# Patient Record
Sex: Male | Born: 1950 | Race: White | Hispanic: No | Marital: Married | Smoking: Never smoker
Health system: Southern US, Community
[De-identification: ages and names within clinical notes are randomized; demographics above are authoritative.]

## PROBLEM LIST (undated history)

## (undated) DIAGNOSIS — Z9081 Acquired absence of spleen: Secondary | ICD-10-CM

## (undated) DIAGNOSIS — I10 Essential (primary) hypertension: Secondary | ICD-10-CM

## (undated) DIAGNOSIS — I2510 Atherosclerotic heart disease of native coronary artery without angina pectoris: Secondary | ICD-10-CM

## (undated) DIAGNOSIS — E119 Type 2 diabetes mellitus without complications: Secondary | ICD-10-CM

## (undated) DIAGNOSIS — E785 Hyperlipidemia, unspecified: Secondary | ICD-10-CM

## (undated) HISTORY — PX: CARDIAC SURGERY: SHX584

---

## 2013-09-08 ENCOUNTER — Encounter (HOSPITAL_COMMUNITY): Payer: Self-pay

## 2013-09-08 ENCOUNTER — Ambulatory Visit (HOSPITAL_COMMUNITY)
Admission: RE | Admit: 2013-09-08 | Discharge: 2013-09-08 | Disposition: A | Discharge status: Home / Self Care | Payer: Medicare Other | Source: Ambulatory Visit | Attending: Internal Medicine | Admitting: Internal Medicine

## 2013-09-08 VITALS — BP 122/78 | HR 71 | Wt 214.0 lb

## 2013-09-08 DIAGNOSIS — I2510 Atherosclerotic heart disease of native coronary artery without angina pectoris: Secondary | ICD-10-CM | POA: Insufficient documentation

## 2013-09-08 DIAGNOSIS — R079 Chest pain, unspecified: Secondary | ICD-10-CM | POA: Insufficient documentation

## 2013-09-08 DIAGNOSIS — Z951 Presence of aortocoronary bypass graft: Secondary | ICD-10-CM | POA: Insufficient documentation

## 2013-09-08 HISTORY — DX: Type 2 diabetes mellitus without complications: E11.9

## 2013-09-08 HISTORY — DX: Atherosclerotic heart disease of native coronary artery without angina pectoris: I25.10

## 2013-09-08 HISTORY — DX: Hyperlipidemia, unspecified: E78.5

## 2013-09-08 HISTORY — DX: Acquired absence of spleen: Z90.81

## 2013-09-08 HISTORY — DX: Essential (primary) hypertension: I10

## 2013-09-08 LAB — CBC
HCT: 49.4 % (ref 39.0–52.0)
Hemoglobin: 17.2 g/dL — ABNORMAL HIGH (ref 13.0–17.0)
MCH: 32 pg (ref 26.0–34.0)
MCHC: 34.8 g/dL (ref 30.0–36.0)
MCV: 91.8 fL (ref 78.0–100.0)
Platelets: 457 10*3/uL — ABNORMAL HIGH (ref 150–400)
RBC: 5.38 MIL/uL (ref 4.22–5.81)
RDW: 14.4 % (ref 11.5–15.5)
WBC: 14.2 10*3/uL — AB (ref 4.0–10.5)

## 2013-09-08 LAB — BASIC METABOLIC PANEL
BUN: 17 mg/dL (ref 6–23)
CALCIUM: 9.6 mg/dL (ref 8.4–10.5)
CHLORIDE: 99 meq/L (ref 96–112)
CO2: 23 mEq/L (ref 19–32)
Creatinine, Ser: 1.21 mg/dL (ref 0.50–1.35)
GFR calc Af Amer: 73 mL/min — ABNORMAL LOW (ref >90)
GFR calc non Af Amer: 63 mL/min — ABNORMAL LOW (ref >90)
Glucose, Bld: 170 mg/dL — ABNORMAL HIGH (ref 70–99)
Potassium: 4.8 mEq/L (ref 3.7–5.3)
Sodium: 137 mEq/L (ref 137–147)

## 2013-09-08 LAB — PROTIME-INR
INR: 0.94 (ref 0.00–1.49)
PROTHROMBIN TIME: 12.4 s (ref 11.6–15.2)

## 2013-09-08 NOTE — Progress Notes (Signed)
 Primary cardiologist: Dr. Kalil ([place]) PCP: Dr. Jobe  Reason for consult: 2nd opinion on CAD  HPI:  Brandon Velez is a 63 y/o male with DM2, CAD s/p CABG in 2004 ([place] - Dr. 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   We do not have any outside records to review. Says he has never had a heart attack but was found to have severe CAD and underwent CABG in 2004. Has been to see multiple cardiologists over the year. Including Dr Tohan at [facility] as well as Dr. Joe Rogers at [facility] and Dr. Tom Posvic with regard to stem cell study - 2 years ago. Has been told in past he doesn't need Transplant evaluation. Also was turned down for stem cell study.  His primary cardiologist has told him he needs to have another transplant evaluation due to persistent angina. Does not know his EF. Last cath 2 years ago - has had 10 total. He thinks 3/5 bypass grafts were blocked.   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 Continues with CP every day despite multiple agents including Ranexa. Unable to tolerate carvedilol due to fatigue. No ASA due to stomach irritation. Can tolerate Plavix. Can come on at rest and with any exertion. Not worse with exertion. Several times per day and last for several hours. Usually gets relief with NTG. +fatigue. CP much worse over past few weeks. At church 3 weeks ago was carrying a table and had episode of CP fell to his knees and vomited. Says ranexa has helped. Has not tried Lidoderm patch.   Multiple other complaints including: dizziness, leg weakness which he associates with his CP, + nausea. He is s/p cholecystectomy.   Discussed case with his cardiologists in [place] and they said there was nothing else they could do so he was referred here.    Review of Systems: [y] = yes, [ ] = no   General: Weight  gain [ ]; Weight loss [ ]; Anorexia [ ]; Fatigue [ y]; Fever [ ]; Chills [ ]; Weakness [ ]  Cardiac: Chest pain/pressure [y ]; Resting SOB [ ]; Exertional SOB [y ]; Orthopnea [ ]; Pedal Edema [ ]; Palpitations [ ]; Syncope [y ]; Presyncope [y ]; Paroxysmal nocturnal dyspnea[ ]  Pulmonary: Cough [ ]; Wheezing[ ]; Hemoptysis[ ]; Sputum [ ]; Snoring [ ]  GI: Vomiting[ y]; Dysphagia[ ]; Melena[ ]; Hematochezia [ ]; Heartburn[ ]; Abdominal pain [ ]; Constipation [ ]; Diarrhea [ ]; BRBPR [ ]  GU: Hematuria[ ]; Dysuria [ ]; Nocturia[ ]  Vascular: Pain in legs with walking [ ]; Pain in feet with lying flat [ ]; Non-healing sores [ ]; Stroke [ ]; TIA [ ]; Slurred speech [ ];  Neuro: Headaches[ ]; Vertigo[ ]; Seizures[ ]; Paresthesias[ ];Blurred vision [ ]; Diplopia [ ]; Vision changes [ ]  Ortho/Skin: Arthritis [ ]; Joint pain [y ]; Muscle pain [ ]; Joint swelling [ ]; Back Pain [ ]; Rash [ ]  Psych: Depression[ ]; Anxiety[ ]  Heme: Bleeding problems [ ]; Clotting disorders [ ]; Anemia [ ]  Endocrine: Diabetes [y ]; Thyroid dysfunction[ ]   Past Medical History  Diagnosis Date  . CAD (coronary artery disease)     s/p CABG 2004  . HTN (hypertension)   . Hyperlipidemia   . H/O   splenectomy   . Diabetes mellitus     Current Outpatient Prescriptions  Medication Sig Dispense Refill  . amLODipine (NORVASC) 5 MG tablet Take 5 mg by mouth daily.      . benazepril (LOTENSIN) 40 MG tablet Take 40 mg by mouth daily.      . Canagliflozin (INVOKANA) 300 MG TABS Take by mouth.      . cetirizine (ZYRTEC) 10 MG tablet Take 10 mg by mouth daily.      . clopidogrel (PLAVIX) 75 MG tablet Take 75 mg by mouth daily with breakfast.      . famotidine (PEPCID) 40 MG tablet Take 40 mg by mouth daily.      . gabapentin (NEURONTIN) 300 MG capsule Take 300 mg by mouth 3 (three) times daily.      . glipiZIDE (GLUCOTROL) 10 MG tablet Take 10 mg by mouth daily before breakfast.      . hydrochlorothiazide (HYDRODIURIL) 25 MG  tablet Take 25 mg by mouth daily.      . insulin glargine (LANTUS) 100 UNIT/ML injection Inject 35 Units into the skin at bedtime.      . insulin lispro (HUMALOG) 100 UNIT/ML injection Inject 4-16 Units into the skin 3 (three) times daily before meals.      . isosorbide mononitrate (IMDUR) 60 MG 24 hr tablet Take 60 mg by mouth daily.      . meclizine (ANTIVERT) 25 MG tablet Take 25 mg by mouth 3 (three) times daily as needed for dizziness.      . ranolazine (RANEXA) 500 MG 12 hr tablet Take 500 mg by mouth 2 (two) times daily.      . rosuvastatin (CRESTOR) 10 MG tablet Take 10 mg by mouth daily.       No current facility-administered medications for this encounter.    Allergies  Allergen Reactions  . Codeine   . Eggs Or Egg-Derived Products   . Penicillins     History   Social History  . Marital Status: Unknown    Spouse Name: N/A    Number of Children: N/A  . Years of Education: N/A   Occupational History  . Not on file.   Social History Main Topics  . Smoking status: Never Smoker   . Smokeless tobacco: Not on file  . Alcohol Use: Not on file  . Drug Use: Not on file  . Sexual Activity: Not on file   Other Topics Concern  . 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 Heart attack Father     died 1996 - 62 y/o  . Heart failure Father   . CAD Mother     s/p CABG 1996    PHYSICAL EXAM: Filed Vitals:   09/08/13 1201  BP: 122/78  Pulse: 71  Weight: 214 lb (97.07 kg)  SpO2: 99%    General:  Well appearing. No respiratory difficulty HEENT: normal Neck: supple. no JVD. Carotids 2+ bilat; no bruits. No lymphadenopathy or thryomegaly appreciated. Cor: PMI nondisplaced. Regular rate & rhythm. No rubs, gallops or murmurs. Lungs: clear Abdomen: soft, nontender, nondistended. No hepatosplenomegaly. No bruits or masses. Good bowel sounds. Extremities: no cyanosis, clubbing, rash,  edema Neuro: alert & oriented x 3, cranial nerves grossly intact. moves all 4 extremities w/o difficulty. Affect pleasant.  ECG:SR 74  1AVB (218 ms) TWI I, AVL - no old to compare   ASSESSMENT &   PLAN:  1) CAD s/p CABG 2004 2) Chest pain, ongoing 3) DM2  Very difficult situation. He has a chronic chest pain syndrome which is now worse. He has had extensive evaluations at [facility] and [facility] without much resolution. I am not sure that all of his pain in cardiac in nature but quality of pain is concerning. Given that his last cath was over 2 years ago I think the first step is to repeat his heart catheterization. That said, I discussed whether the cath should be done here or back at [facility]. He will contact [facility] and if unable to get an appt in the very near future he will get back to us and we will schedule cath. We also discussed possibility of trying Lidoderm patch. Will draw pre-cath labs today.   Daniel Bensimhon,MD 1:03 PM   

## 2013-09-08 NOTE — Patient Instructions (Signed)
Labs today  Call us back if you want to have Cath done here

## 2013-09-09 ENCOUNTER — Telehealth (HOSPITAL_COMMUNITY): Payer: Self-pay

## 2013-09-09 ENCOUNTER — Other Ambulatory Visit (HOSPITAL_COMMUNITY): Payer: Self-pay

## 2013-09-09 NOTE — Addendum Note (Signed)
Encounter addended by: Deitra MayoBeverly H George Alcantar, CCT on: 09/09/2013  9:57 AM<BR>     Documentation filed: Charges VN

## 2013-09-09 NOTE — Telephone Encounter (Signed)
Patient called and made aware of cardiac cath scheduled for 3/27 at 0730 with Dr. SwazilandJordan.  Instructed to arrive to main entrance for check in at 0600, to remain NPO after midnight, to have someone to drive him home afterwards, to pack an overnight bag, and to keep all valuables at home.  Patient understands and has no questions/concerns.

## 2013-09-13 ENCOUNTER — Encounter (HOSPITAL_COMMUNITY): Payer: Self-pay | Admitting: Pharmacy Technician

## 2013-09-16 ENCOUNTER — Encounter (HOSPITAL_COMMUNITY)
Admission: RE | Disposition: A | Discharge status: Home / Self Care | Payer: Self-pay | Source: Ambulatory Visit | Attending: Internal Medicine

## 2013-09-16 ENCOUNTER — Ambulatory Visit (HOSPITAL_COMMUNITY)
Admission: RE | Admit: 2013-09-16 | Discharge: 2013-09-16 | Disposition: A | Discharge status: Home / Self Care | Payer: Medicare Other | Source: Ambulatory Visit | Attending: Internal Medicine | Admitting: Internal Medicine

## 2013-09-16 DIAGNOSIS — Z7902 Long term (current) use of antithrombotics/antiplatelets: Secondary | ICD-10-CM | POA: Insufficient documentation

## 2013-09-16 DIAGNOSIS — I2510 Atherosclerotic heart disease of native coronary artery without angina pectoris: Secondary | ICD-10-CM | POA: Insufficient documentation

## 2013-09-16 DIAGNOSIS — E119 Type 2 diabetes mellitus without complications: Secondary | ICD-10-CM | POA: Insufficient documentation

## 2013-09-16 DIAGNOSIS — Z794 Long term (current) use of insulin: Secondary | ICD-10-CM | POA: Insufficient documentation

## 2013-09-16 DIAGNOSIS — I25810 Atherosclerosis of coronary artery bypass graft(s) without angina pectoris: Secondary | ICD-10-CM | POA: Insufficient documentation

## 2013-09-16 DIAGNOSIS — E785 Hyperlipidemia, unspecified: Secondary | ICD-10-CM | POA: Insufficient documentation

## 2013-09-16 DIAGNOSIS — I209 Angina pectoris, unspecified: Secondary | ICD-10-CM | POA: Insufficient documentation

## 2013-09-16 DIAGNOSIS — I10 Essential (primary) hypertension: Secondary | ICD-10-CM | POA: Insufficient documentation

## 2013-09-16 HISTORY — PX: LEFT HEART CATHETERIZATION WITH CORONARY ANGIOGRAM: SHX5451

## 2013-09-16 LAB — GLUCOSE, CAPILLARY
Glucose-Capillary: 111 mg/dL — ABNORMAL HIGH (ref 70–99)
Glucose-Capillary: 89 mg/dL (ref 70–99)

## 2013-09-16 SURGERY — LEFT HEART CATHETERIZATION WITH CORONARY ANGIOGRAM
Anesthesia: LOCAL

## 2013-09-16 MED ORDER — NITROGLYCERIN 0.2 MG/ML ON CALL CATH LAB
INTRAVENOUS | Status: AC
  Filled 2013-09-16: qty 1

## 2013-09-16 MED ORDER — HEPARIN SODIUM (PORCINE) 1000 UNIT/ML IJ SOLN
INTRAMUSCULAR | Status: AC
  Filled 2013-09-16: qty 1

## 2013-09-16 MED ORDER — SODIUM CHLORIDE 0.9 % IV SOLN: 1.0000 mL/kg/h | INTRAVENOUS | Status: DC

## 2013-09-16 MED ORDER — SODIUM CHLORIDE 0.9 % IJ SOLN: 3.0000 mL | INTRAMUSCULAR | Status: DC | PRN

## 2013-09-16 MED ORDER — ASPIRIN 81 MG PO CHEW
81.0000 mg | CHEWABLE_TABLET | ORAL | Status: AC
  Administered 2013-09-16: 81 mg via ORAL
  Filled 2013-09-16: qty 1

## 2013-09-16 MED ORDER — MIDAZOLAM HCL 2 MG/2ML IJ SOLN
INTRAMUSCULAR | Status: AC
  Filled 2013-09-16: qty 2

## 2013-09-16 MED ORDER — SODIUM CHLORIDE 0.9 % IV SOLN: 250.0000 mL | INTRAVENOUS | Status: DC | PRN

## 2013-09-16 MED ORDER — FENTANYL CITRATE 0.05 MG/ML IJ SOLN
INTRAMUSCULAR | Status: AC
  Filled 2013-09-16: qty 2

## 2013-09-16 MED ORDER — VERAPAMIL HCL 2.5 MG/ML IV SOLN
INTRAVENOUS | Status: AC
  Filled 2013-09-16: qty 2

## 2013-09-16 MED ORDER — HEPARIN (PORCINE) IN NACL 2-0.9 UNIT/ML-% IJ SOLN
INTRAMUSCULAR | Status: AC
  Filled 2013-09-16: qty 1000

## 2013-09-16 MED ORDER — SODIUM CHLORIDE 0.9 % IJ SOLN
3.0000 mL | Freq: Two times a day (BID) | INTRAMUSCULAR | Status: DC
Start: 2013-09-16 — End: 2013-09-16

## 2013-09-16 MED ORDER — SODIUM CHLORIDE 0.9 % IV SOLN
INTRAVENOUS | Status: DC
  Administered 2013-09-16: 07:00:00 via INTRAVENOUS

## 2013-09-16 MED ORDER — LIDOCAINE HCL (PF) 1 % IJ SOLN
INTRAMUSCULAR | Status: AC
  Filled 2013-09-16: qty 30

## 2013-09-16 NOTE — Interval H&P Note (Signed)
History and Physical Interval Note:  09/16/2013 7:45 AM  Marlana LatusStephen Cherian  has presented today for surgery, with the diagnosis of cp  The various methods of treatment have been discussed with the patient and family. After consideration of risks, benefits and other options for treatment, the patient has consented to  Procedure(s): LEFT HEART CATHETERIZATION WITH CORONARY ANGIOGRAM (N/A) as a surgical intervention .  The patient's history has been reviewed, patient examined, no change in status, stable for surgery.  I have reviewed the patient's chart and labs.  Questions were answered to the patient's satisfaction.   Cath Lab Visit (complete for each Cath Lab visit)  Clinical Evaluation Leading to the Procedure:   ACS: no  Non-ACS:    Anginal Classification: CCS IV  Anti-ischemic medical therapy: Maximal Therapy (2 or more classes of medications)  Non-Invasive Test Results: No non-invasive testing performed  Prior CABG: Previous CABG        [name] [facility] HospitalJordanMD,FACC 09/16/2013 7:45 AM

## 2013-09-16 NOTE — Discharge Instructions (Signed)

## 2013-09-16 NOTE — H&P (View-Only) (Signed)
Primary cardiologist: Dr. Bary CastillaKalil [facility]([place]) PCP: Dr. Derrell LollingJobe  Reason for consult: 2nd opinion on CAD  HPI:  Brandon Velez is a 63 y/o male with DM2, CAD s/p CABG in 2004 [facility]([place] - Dr. 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   We do not have any outside records to review. Says he has never had a heart attack but was found to have severe CAD and underwent CABG in 2004. Has been to see multiple cardiologists over the year. Including [facility]han [facility] as well as Dr. Jerrol BananaJoe Rogers at [facility] and Dr. Elijah Birkom Posvic with regard to stem cell study - 2 years ago. Has been told in past he doesn't need Transplant evaluation. Also was turned down for stem cell study.  His primary cardiologist has told him he needs to have another transplant evaluation due to persistent angina. Does not know his EF. Last cath 2 years ago - has had 10 total. He thinks 3/5 bypass grafts were blocked.   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 Continues with CP every day despite multiple agents including Ranexa. Unable to tolerate carvedilol due to fatigue. No ASA due to stomach irritation. Can tolerate Plavix. Can come on at rest and with any exertion. Not worse with exertion. Several times per day and last for several hours. Usually gets relief with NTG. +fatigue. CP much worse over past few weeks. At church 3 weeks ago was carrying a table and had episode of CP fell to his knees and vomited. Says ranexa has helped. Has not tried Lidoderm patch.   Multiple other complaints including: dizziness, leg weakness which he associates with his CP, + nausea. He is s/p cholecystectomy.   Discussed case with his cardiologists in [place] and they said there was nothing else they could do so he was referred here.    Review of Systems: [y] = yes, [ ]  = no   General: Weight  gain [ ] ; Weight loss [ ] ; Anorexia [ ] ; Fatigue [ y]; Fever [ ] ; Chills [ ] ; Weakness [ ]   Cardiac: Chest pain/pressure [place]ienne[y ]; Resting SOB [ ] ; Exertional SOB [place]ienne[y ]; Orthopnea [ ] ; Pedal Edema [ ] ; Palpitations [ ] ; Syncope [place]ienne[y ]; Presyncope [place]ienne[y ]; Paroxysmal nocturnal dyspnea[ ]   Pulmonary: Cough [ ] ; Wheezing[ ] ; Hemoptysis[ ] ; Sputum [ ] ; Snoring [ ]   GI: Vomiting[ y]; Dysphagia[ ] ; Melena[ ] ; Hematochezia [ ] ; Heartburn[ ] ; Abdominal pain [ ] ; Constipation [ ] ; Diarrhea [ ] ; BRBPR [ ]   GU: Hematuria[ ] ; Dysuria [ ] ; Nocturia[ ]   Vascular: Pain in legs with walking [ ] ; Pain in feet with lying flat [ ] ; Non-healing sores [ ] ; Stroke [ ] ; TIA [ ] ; Slurred speech [ ] ;  Neuro: Headaches[ ] ; Vertigo[ ] ; Seizures[ ] ; Paresthesias[ ] ;Blurred vision [ ] ; Diplopia [ ] ; Vision changes [ ]   Ortho/Skin: Arthritis [ ] ; Joint pain [place].Etienne[y ]; Muscle pain [ ] ; Joint swelling [ ] ; Back Pain [ ] ; Rash [ ]   Psych: Depression[ ] ; Anxiety[ ]   Heme: Bleeding problems [ ] ; Clotting disorders [ ] ; Anemia [ ]   Endocrine: Diabetes C[place]ienne[y ]; Thyroid dysfunction[ ]    Past Medical History  Diagnosis Date  . CAD (coronary artery disease)     s/p CABG 2004  . HTN (hypertension)   . Hyperlipidemia   . H/O  splenectomy   . Diabetes mellitus     Current Outpatient Prescriptions  Medication Sig Dispense Refill  . amLODipine (NORVASC) 5 MG tablet Take 5 mg by mouth daily.      . benazepril (LOTENSIN) 40 MG tablet Take 40 mg by mouth daily.      . Canagliflozin (INVOKANA) 300 MG TABS Take by mouth.      . cetirizine (ZYRTEC) 10 MG tablet Take 10 mg by mouth daily.      . clopidogrel (PLAVIX) 75 MG tablet Take 75 mg by mouth daily with breakfast.      . famotidine (PEPCID) 40 MG tablet Take 40 mg by mouth daily.      Marland Kitchen gabapentin (NEURONTIN) 300 MG capsule Take 300 mg by mouth 3 (three) times daily.      Marland Kitchen glipiZIDE (GLUCOTROL) 10 MG tablet Take 10 mg by mouth daily before breakfast.      . hydrochlorothiazide (HYDRODIURIL) 25 MG  tablet Take 25 mg by mouth daily.      . insulin glargine (LANTUS) 100 UNIT/ML injection Inject 35 Units into the skin at bedtime.      . insulin lispro (HUMALOG) 100 UNIT/ML injection Inject 4-16 Units into the skin 3 (three) times daily before meals.      . isosorbide mononitrate (IMDUR) 60 MG 24 hr tablet Take 60 mg by mouth daily.      . meclizine (ANTIVERT) 25 MG tablet Take 25 mg by mouth 3 (three) times daily as needed for dizziness.      . ranolazine (RANEXA) 500 MG 12 hr tablet Take 500 mg by mouth 2 (two) times daily.      . rosuvastatin (CRESTOR) 10 MG tablet Take 10 mg by mouth daily.       No current facility-administered medications for this encounter.    Allergies  Allergen Reactions  . Codeine   . Eggs Or Egg-Derived Products   . Penicillins     History   Social History  . Marital Status: Unknown    Spouse Name: N/A    Number of Children: N/A  . Years of Education: N/A   Occupational History  . Not on file.   Social History Main Topics  . Smoking status: Never Smoker   . Smokeless tobacco: Not on file  . Alcohol Use: Not on file  . Drug Use: Not on file  . Sexual Activity: Not on file   Other Topics Concern  . 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 Heart attack Father     died 6 - 72 y/o  . Heart failure Father   . CAD Mother     s/p CABG 1996    PHYSICAL EXAM: Filed Vitals:   09/08/13 1201  BP: 122/78  Pulse: 71  Weight: 214 lb (97.07 kg)  SpO2: 99%    General:  Well appearing. No respiratory difficulty HEENT: normal Neck: supple. no JVD. Carotids 2+ bilat; no bruits. No lymphadenopathy or thryomegaly appreciated. Cor: PMI nondisplaced. Regular rate & rhythm. No rubs, gallops or murmurs. Lungs: clear Abdomen: soft, nontender, nondistended. No hepatosplenomegaly. No bruits or masses. Good bowel sounds. Extremities: no cyanosis, clubbing, rash,  edema Neuro: alert & oriented x 3, cranial nerves grossly intact. moves all 4 extremities w/o difficulty. Affect pleasant.  ECG:SR 74  1AVB (218 ms) TWI I, AVL - no old to compare   ASSESSMENT &  PLAN:  1) CAD s/p CABG 2004 2) Chest pain, ongoing 3) DM2  Very difficult situation. He has a chronic chest pain syndrome which is now worse. He has had extensive evaluations at [facility] and [facility] without much resolution. I am not sure that all of his pain in cardiac in nature but quality of pain is concerning. Given that his last cath was over 2 years ago I think the first step is to repeat his heart catheterization. That said, I discussed whether the cath should be done here or back at [facility]. He will contact [facility] and if unable to get an appt in the very near future he will get back to [place] and we will schedule cath. We also discussed possibility of trying Lidoderm patch. Will draw pre-cath labs today.   Daniel Bensimhon,MD 1:03 PM

## 2013-09-16 NOTE — CV Procedure (Signed)
    Cardiac Catheterization Procedure Note  Name: Brandon LatusStephen Velez MRN: [id number] DOB: 02/16/1951  Procedure: Left Heart Cath, Selective Coronary Angiography, SVG angiography, LIMA angiography, LV angiography  Indication: 63 yo WM with history of CAD s/p CABG presents with refractory class 3-4 angina despite optimal medical therapy.   Procedural Details: The left wrist was prepped, draped, and anesthetized with 1% lidocaine. Using the modified Seldinger technique, a 5 French sheath was introduced into the left radial artery. 3 mg of verapamil was administered through the sheath, weight-based unfractionated heparin was administered intravenously. Standard Judkins catheters were used for selective coronary angiography and left ventriculography. An AL1 catheter was used to engage the left coronary artery and SVG to the OM. An LCB catheter was used to engage the SVG to diagonal. An IMA catheter was used to access the LIMA graft. Catheter exchanges were performed over an exchange length guidewire. There were no immediate procedural complications. A TR band was used for radial hemostasis at the completion of the procedure.  The patient was transferred to the post catheterization recovery area for further monitoring.  Procedural Findings: Hemodynamics: AO 127/60 mean 87 mm Hg LV 132/17 mm Hg  Coronary angiography: Coronary dominance: right  Left mainstem: The left main tapers to 30% distally.  Left anterior descending (LAD): 100% ostial  Ramus intermediate: The ramus branch bifurcates proximally. The more anterior branch is moderate in size and bifurcates again in the mid vessel. The proximal portion of this branch is diffusely diseased up to 80-90%. There is a 90% true bifurcation stenosis in the mid vessel. The more lateral branch of the intermediate is occluded.  Left circumflex (LCx): The LCx gives off 2 OM branches before terminating in a third OM. The LCx is diffusely diseased in the proximal  to mid vessel up to 80-90%. This vessel is small in caliber. The first and second OMs are occluded.   Right coronary artery (RCA): The RCA has segmental 80% disease in the mid vessel. The distal vessel is occluded. There are bridging collaterals to the PLOM.  The SVG to the PDA is patent. There is 90% disease in the mid to distal PDA.  The SVG to the second OM is patent. There is retrograde filling of the first OM. The first OM is diffusely diseased to 40%.   The SVG to the diagonal is occluded.  The LIMA graft to the LAD is patent. This fills the entire LAD retrograde. The first diagonal is occluded with faint left to left collaterals. The LAD distal to the graft is small and diffusely diseased up to 80% at the apex.   Left ventriculography: Left ventricular systolic function is normal, LVEF is estimated at 55-65%, there is no significant mitral regurgitation   Final Conclusions:   1. Severe 3 vessel obstructive CAD 2. Patent LIMA to the LAD. 3. Patent SVG to the OM2 4. Patent SVG to the PDA 5. Occluded SVG to the diagonal.  6. Normal LV function.  Recommendations: Brandon Velez has significant small and distal vessel disease that is not readily amenable to intervention. 3/4 bypass grafts are functioning well. The diagonal graft is occluded but the diagonal appears to be a very small branch. I will review with my interventional colleagues to consider options. Physiologic testing may be beneficial to assess degree and location of ischemia.   [name] [facility]MD,FACC 09/16/2013, 8:27 AM

## 2013-09-16 NOTE — Progress Notes (Signed)
Left radial bleed moderate amount, 3 cc reinserted.  Dr SwazilandJordan aware, extend band deflation per protocol.  Will continue to monitor

## 2013-09-18 ENCOUNTER — Encounter (HOSPITAL_COMMUNITY): Payer: Self-pay | Admitting: Emergency Medicine

## 2013-09-18 ENCOUNTER — Telehealth: Payer: Self-pay | Admitting: Cardiology

## 2013-09-18 ENCOUNTER — Emergency Department (HOSPITAL_COMMUNITY)
Admission: EM | Admit: 2013-09-18 | Discharge: 2013-09-18 | Disposition: A | Discharge status: Home / Self Care | Payer: Medicare Other | Attending: Emergency Medicine | Admitting: Emergency Medicine

## 2013-09-18 DIAGNOSIS — L253 Unspecified contact dermatitis due to other chemical products: Secondary | ICD-10-CM | POA: Insufficient documentation

## 2013-09-18 DIAGNOSIS — Z885 Allergy status to narcotic agent status: Secondary | ICD-10-CM | POA: Insufficient documentation

## 2013-09-18 DIAGNOSIS — Z880 Allergy status to penicillin: Secondary | ICD-10-CM | POA: Insufficient documentation

## 2013-09-18 DIAGNOSIS — E119 Type 2 diabetes mellitus without complications: Secondary | ICD-10-CM | POA: Insufficient documentation

## 2013-09-18 DIAGNOSIS — I2510 Atherosclerotic heart disease of native coronary artery without angina pectoris: Secondary | ICD-10-CM | POA: Insufficient documentation

## 2013-09-18 DIAGNOSIS — E785 Hyperlipidemia, unspecified: Secondary | ICD-10-CM | POA: Insufficient documentation

## 2013-09-18 DIAGNOSIS — Z794 Long term (current) use of insulin: Secondary | ICD-10-CM | POA: Insufficient documentation

## 2013-09-18 DIAGNOSIS — L231 Allergic contact dermatitis due to adhesives: Secondary | ICD-10-CM

## 2013-09-18 DIAGNOSIS — I10 Essential (primary) hypertension: Secondary | ICD-10-CM | POA: Insufficient documentation

## 2013-09-18 DIAGNOSIS — Z79899 Other long term (current) drug therapy: Secondary | ICD-10-CM | POA: Insufficient documentation

## 2013-09-18 DIAGNOSIS — Z7902 Long term (current) use of antithrombotics/antiplatelets: Secondary | ICD-10-CM | POA: Insufficient documentation

## 2013-09-18 DIAGNOSIS — Z9089 Acquired absence of other organs: Secondary | ICD-10-CM | POA: Insufficient documentation

## 2013-09-18 MED ORDER — HYDROCORTISONE 1 % EX CREA
TOPICAL_CREAM | Freq: Three times a day (TID) | CUTANEOUS | Status: DC
  Filled 2013-09-18: qty 28

## 2013-09-18 MED ORDER — BENAZEPRIL HCL 40 MG PO TABS
40.0000 mg | ORAL_TABLET | Freq: Every day | ORAL | Status: DC
  Administered 2013-09-18: 40 mg via ORAL
  Filled 2013-09-18: qty 1

## 2013-09-18 MED ORDER — HYDROCORTISONE 1 % EX CREA
TOPICAL_CREAM | Freq: Three times a day (TID) | CUTANEOUS | Status: DC
  Administered 2013-09-18: 19:00:00 via TOPICAL

## 2013-09-18 MED ORDER — AMLODIPINE BESYLATE 5 MG PO TABS
5.0000 mg | ORAL_TABLET | Freq: Once | ORAL | Status: AC
  Administered 2013-09-18: 5 mg via ORAL
  Filled 2013-09-18: qty 1

## 2013-09-18 NOTE — ED Provider Notes (Signed)
Medical screening examination/treatment/procedure(s) were conducted as a shared visit with non-physician practitioner(s) and myself.  I personally evaluated the patient during the encounter.   EKG Interpretation None      Patient seen and examined. K. reviewed with PA. Bedside Allen's test is normal. Limited bedside ultrasound shows normal flow without obvious hematoma or pseudoaneurysm. This is limited exam performed by myself. It appears this is most consistent with contact dermatitis. Is in the area where his dressing was on over his cath site. He removed it 24 hours that was immediately present. It is itching. It is not painful. It is not causing him other symptoms with fevers or chills or general illness.    Brandon PorterMark Jaden Batchelder, MD 09/18/13 2020

## 2013-09-18 NOTE — ED Provider Notes (Signed)
CSN: [id number]     Arrival date & time 09/18/13  1659 History   First MD Initiated Contact with Patient 09/18/13 1726     Chief Complaint  Patient presents with  . redness around cath site      (Consider location/radiation/quality/duration/timing/severity/associated sxs/prior Treatment) HPI Brandon Velez is a 63 y.o. male who presents to emergency department complaining of discoloration to the left wrist through which she received a cardiac cath 2 days ago. Patient states he was discharged 2 days ago, states he had a dressing on his left wrist from the procedure. He states that yesterday he took all of the dressing, and shortly after noted some erythema and swelling around the puncture site. Patient states that area is tender, mainly over the puncture site. He denies any significant swelling, only old for the rash area. He denies any tenderness over the rash area. He denies any pain with movement of the hand. He denies any numbness or color discoloration distal to the puncture site. No other complaints. No fever chills or malaise.  Past Medical History  Diagnosis Date  . CAD (coronary artery disease)     s/p CABG 2004  . HTN (hypertension)   . Hyperlipidemia   . H/O splenectomy   . Diabetes mellitus    History reviewed. No pertinent past surgical history. Family History  Problem Relation Age of Onset  . Heart attack Father     died 47 - 51 y/o  . Heart failure Father   . CAD Mother     s/p CABG 1996   History  Substance Use Topics  . Smoking status: Never Smoker   . Smokeless tobacco: Not on file  . Alcohol Use: No    Review of Systems  Constitutional: Negative for fever and chills.  Skin: Positive for color change and rash.  Neurological: Negative for weakness and numbness.  All other systems reviewed and are negative.      Allergies  Eggs or egg-derived products; Other; Codeine; and Penicillins  Home Medications   Current Outpatient Rx  Name  Route  Sig   Dispense  Refill  . amLODipine (NORVASC) 5 MG tablet   Oral   Take 5 mg by mouth daily.         . benazepril (LOTENSIN) 40 MG tablet   Oral   Take 40 mg by mouth daily.         . Canagliflozin (INVOKANA) 300 MG TABS   Oral   Take 300 mg by mouth daily.          . cetirizine (ZYRTEC) 10 MG tablet   Oral   Take 10 mg by mouth daily.         . clopidogrel (PLAVIX) 75 MG tablet   Oral   Take 75 mg by mouth daily with breakfast.         . famotidine (PEPCID) 40 MG tablet   Oral   Take 40 mg by mouth daily with supper.          . gabapentin (NEURONTIN) 300 MG capsule   Oral   Take 300 mg by mouth at bedtime.          Marland Kitchen glipiZIDE (GLUCOTROL XL) 10 MG 24 hr tablet   Oral   Take 10 mg by mouth 2 (two) times daily.          . Insulin Glargine (LANTUS SOLOSTAR) 100 UNIT/ML Solostar Pen   Subcutaneous   Inject 30 Units into the skin at  bedtime.         . insulin lispro (HUMALOG KWIKPEN) 100 UNIT/ML KiwkPen   Subcutaneous   Inject 0-16 Units into the skin 2 (two) times daily as needed (cbg >200).         . isosorbide mononitrate (IMDUR) 60 MG 24 hr tablet   Oral   Take 60 mg by mouth daily.         . meclizine (ANTIVERT) 25 MG tablet   Oral   Take 25 mg by mouth 3 (three) times daily as needed for dizziness.         . nitroGLYCERIN (NITROLINGUAL) 0.4 MG/SPRAY spray   Sublingual   Place 1 spray under the tongue every 5 (five) minutes x 3 doses as needed for chest pain.         . ranolazine (RANEXA) 1000 MG SR tablet   Oral   Take 500 mg by mouth 2 (two) times daily.          BP 193/83  Pulse 79  Temp(Src) 98.7 F (37.1 C) (Oral)  Resp 20  Wt 208 lb (94.348 kg)  SpO2 99% Physical Exam  Nursing note and vitals reviewed. Constitutional: He appears well-developed and well-nourished. No distress.  HENT:  Head: Normocephalic and atraumatic.  Eyes: Conjunctivae are normal.  Neck: Neck supple.  Cardiovascular: Normal rate, regular rhythm  and normal heart sounds.   Pulmonary/Chest: Effort normal. No respiratory distress. He has no wheezes. He has no rales.  Musculoskeletal: He exhibits no edema.  Neurological: He is alert.  Skin: Skin is warm and dry.  Puncture site noted to the left wrist over the distal radial artery. No swelling noted. Distal radial pulses intact. Allen's test showed good flow to the hand the radial artery. There is erythematous, fine papular rash around the puncture site. It is nontender. Is not warm to the touch. It is pruritic.    ED Course  Procedures (including critical care time) Labs Review Labs Reviewed - No data to display Imaging Review No results found.   EKG Interpretation None      MDM   Final diagnoses:  Contact dermatitis due to adhesives  Hypertension   Exam and history suspicious for contact dermatitis over the area which was covered by bandages. No signs of infection. Bedside ultrasound these by Dr. Fayrene FearingJames to rule out aneurysm or clot. There is a good blood flow through the wrist into the hand. The area did not enlarge in size since yesterday, did not think this is cellulitis. He is afebrile. Otherwise nontoxic. Blood pressure was found to be elevated, patient did not take his blood pressure medications today. Will order a dose here. He is asymptomatic for it.  Will treat rash with hydrocortisone cream and Benadryl orally. Followup with his Dr. Davina PokeInstructed to return to the ER if  redness progressing further up the arm.  Filed Vitals:   09/18/13 1707 09/18/13 1737 09/18/13 1827 09/18/13 1900  BP: 188/80 193/83 155/68 172/68  Pulse: 82 79 74 74  Temp: 98.7 F (37.1 C)     TempSrc: Oral     Resp: 20   18  Weight: 208 lb (94.348 kg)     SpO2: 98% 99% 94% 97%      Lottie Musselatyana A Bryndon Cumbie, PA-C 09/18/13 1913

## 2013-09-18 NOTE — Telephone Encounter (Signed)
Pt called he has red rash on rt wrist at cath site, warm to touch, 2 inches wide and 6 inches long.  He has not fever.  Instructed to go to urgent care or ER to be evaluated in case of infection.  He is agreeable.

## 2013-09-18 NOTE — ED Notes (Signed)
The pt was cathed on Friday and the injection site was his lt wrist area.  Yesterday the area started getting red.  Today the redness has increased and itching  Around the redness no temp

## 2013-09-18 NOTE — Discharge Instructions (Signed)
Apply hydrocortisone cream 2-3 times a day to the rash area. Take benadryl for itching and to help with reaction. Watch closely. If spreading, return to ER. Also make sure to take your blood pressure medications and have it rechecked in 1 week.    Contact Dermatitis Contact dermatitis is a reaction to certain substances that touch the skin. Contact dermatitis can be either irritant contact dermatitis or allergic contact dermatitis. Irritant contact dermatitis does not require previous exposure to the substance for a reaction to occur.Allergic contact dermatitis only occurs if you have been exposed to the substance before. Upon a repeat exposure, your body reacts to the substance.  CAUSES  Many substances can cause contact dermatitis. Irritant dermatitis is most commonly caused by repeated exposure to mildly irritating substances, such as:  Makeup.  Soaps.  Detergents.  Bleaches.  Acids.  Metal salts, such as nickel. Allergic contact dermatitis is most commonly caused by exposure to:  Poisonous plants.  Chemicals (deodorants, shampoos).  Jewelry.  Latex.  Neomycin in triple antibiotic cream.  Preservatives in products, including clothing. SYMPTOMS  The area of skin that is exposed may develop:  Dryness or flaking.  Redness.  Cracks.  Itching.  Pain or a burning sensation.  Blisters. With allergic contact dermatitis, there may also be swelling in areas such as the eyelids, mouth, or genitals.  DIAGNOSIS  Your caregiver can usually tell what the problem is by doing a physical exam. In cases where the cause is uncertain and an allergic contact dermatitis is suspected, a patch skin test may be performed to help determine the cause of your dermatitis. TREATMENT Treatment includes protecting the skin from further contact with the irritating substance by avoiding that substance if possible. Barrier creams, powders, and gloves may be helpful. Your caregiver may also  recommend:  Steroid creams or ointments applied 2 times daily. For best results, soak the rash area in cool water for 20 minutes. Then apply the medicine. Cover the area with a plastic wrap. You can store the steroid cream in the refrigerator for a "chilly" effect on your rash. That may decrease itching. Oral steroid medicines may be needed in more severe cases.  Antibiotics or antibacterial ointments if a skin infection is present.  Antihistamine lotion or an antihistamine taken by mouth to ease itching.  Lubricants to keep moisture in your skin.  Burow's solution to reduce redness and soreness or to dry a weeping rash. Mix one packet or tablet of solution in 2 cups cool water. Dip a clean washcloth in the mixture, wring it out a bit, and put it on the affected area. Leave the cloth in place for 30 minutes. Do this as often as possible throughout the day.  Taking several cornstarch or baking soda baths daily if the area is too large to cover with a washcloth. Harsh chemicals, such as alkalis or acids, can cause skin damage that is like a burn. You should flush your skin for 15 to 20 minutes with cold water after such an exposure. You should also seek immediate medical care after exposure. Bandages (dressings), antibiotics, and pain medicine may be needed for severely irritated skin.  HOME CARE INSTRUCTIONS  Avoid the substance that caused your reaction.  Keep the area of skin that is affected away from hot water, soap, sunlight, chemicals, acidic substances, or anything else that would irritate your skin.  Do not scratch the rash. Scratching may cause the rash to become infected.  You may take cool baths to  help stop the itching.  Only take over-the-counter or prescription medicines as directed by your caregiver.  See your caregiver for follow-up care as directed to make sure your skin is healing properly. SEEK MEDICAL CARE IF:   Your condition is not better after 3 days of  treatment.  You seem to be getting worse.  You see signs of infection such as swelling, tenderness, redness, soreness, or warmth in the affected area.  You have any problems related to your medicines. Document Released: 06/06/2000 Document Revised: 09/01/2011 Document Reviewed: 11/12/2010 [facility] Patient Information 2014 [place], [place].  Arterial Hypertension Arterial hypertension (high blood pressure) is a condition of elevated pressure in your blood vessels. Hypertension over a long period of time is a risk factor for strokes, heart attacks, and heart failure. It is also the leading cause of kidney (renal) failure.  CAUSES   In Adults -- Over 90% of all hypertension has no known cause. This is called essential or primary hypertension. In the other 10% of people with hypertension, the increase in blood pressure is caused by another disorder. This is called secondary hypertension. Important causes of secondary hypertension are:  Heavy alcohol use.  Obstructive sleep apnea.  Hyperaldosterosim (Conn's syndrome).  Steroid use.  Chronic kidney failure.  Hyperparathyroidism.  Medications.  Renal artery stenosis.  Pheochromocytoma.  Cushing's disease.  Coarctation of the aorta.  Scleroderma renal crisis.  Licorice (in excessive amounts).  Drugs (cocaine, methamphetamine). Your caregiver can explain any items above that apply to you.  In Children -- Secondary hypertension is more common and should always be considered.  Pregnancy -- Few women of childbearing age have high blood pressure. However, up to 10% of them develop hypertension of pregnancy. Generally, this will not harm the woman. It may be a sign of 3 complications of pregnancy: preeclampsia, HELLP syndrome, and eclampsia. Follow up and control with medication is necessary. SYMPTOMS   This condition normally does not produce any noticeable symptoms. It is usually found during a routine exam.  Malignant  hypertension is a late problem of high blood pressure. It may have the following symptoms:  Headaches.  Blurred vision.  End-organ damage (this means your kidneys, heart, lungs, and other organs are being damaged).  Stressful situations can increase the blood pressure. If a person with normal blood pressure has their blood pressure go up while being seen by their caregiver, this is often termed "white coat hypertension." Its importance is not known. It may be related with eventually developing hypertension or complications of hypertension.  Hypertension is often confused with mental tension, stress, and anxiety. DIAGNOSIS  The diagnosis is made by 3 separate blood pressure measurements. They are taken at least 1 week apart from each other. If there is organ damage from hypertension, the diagnosis may be made without repeat measurements. Hypertension is usually identified by having blood pressure readings:  Above 140/90 mmHg measured in both arms, at 3 separate times, over a couple weeks.  Over 130/80 mmHg should be considered a risk factor and may require treatment in patients with diabetes. Blood pressure readings over 120/80 mmHg are called "pre-hypertension" even in non-diabetic patients. To get a true blood pressure measurement, use the following guidelines. Be aware of the factors that can alter blood pressure readings.  Take measurements at least 1 hour after caffeine.  Take measurements 30 minutes after smoking and without any stress. This is another reason to quit smoking  it raises your blood pressure.  Use a proper cuff size. Ask  your caregiver if you are not sure about your cuff size.  Most home blood pressure cuffs are automatic. They will measure systolic and diastolic pressures. The systolic pressure is the pressure reading at the start of sounds. Diastolic pressure is the pressure at which the sounds disappear. If you are elderly, measure pressures in multiple postures. Try  sitting, lying or standing.  Sit at rest for a minimum of 5 minutes before taking measurements.  You should not be on any medications like decongestants. These are found in many cold medications.  Record your blood pressure readings and review them with your caregiver. If you have hypertension:  Your caregiver may do tests to be sure you do not have secondary hypertension (see "causes" above).  Your caregiver may also look for signs of metabolic syndrome. This is also called Syndrome X or Insulin Resistance Syndrome. You may have this syndrome if you have type 2 diabetes, abdominal obesity, and abnormal blood lipids in addition to hypertension.  Your caregiver will take your medical and family history and perform a physical exam.  Diagnostic tests may include blood tests (for glucose, cholesterol, potassium, and kidney function), a urinalysis, or an EKG. Other tests may also be necessary depending on your condition. PREVENTION  There are important lifestyle issues that you can adopt to reduce your chance of developing hypertension:  Maintain a normal weight.  Limit the amount of salt (sodium) in your diet.  Exercise often.  Limit alcohol intake.  Get enough potassium in your diet. Discuss specific advice with your caregiver.  Follow a DASH diet (dietary approaches to stop hypertension). This diet is rich in fruits, vegetables, and low-fat dairy products, and avoids certain fats. PROGNOSIS  Essential hypertension cannot be cured. Lifestyle changes and medical treatment can lower blood pressure and reduce complications. The prognosis of secondary hypertension depends on the underlying cause. Many people whose hypertension is controlled with medicine or lifestyle changes can live a normal, healthy life.  RISKS AND COMPLICATIONS  While high blood pressure alone is not an illness, it often requires treatment due to its short- and long-term effects on many organs. Hypertension increases  your risk for:  CVAs or strokes (cerebrovascular accident).  Heart failure due to chronically high blood pressure (hypertensive cardiomyopathy).  Heart attack (myocardial infarction).  Damage to the retina (hypertensive retinopathy).  Kidney failure (hypertensive nephropathy). Your caregiver can explain list items above that apply to you. Treatment of hypertension can significantly reduce the risk of complications. TREATMENT   For overweight patients, weight loss and regular exercise are recommended. Physical fitness lowers blood pressure.  Mild hypertension is usually treated with diet and exercise. A diet rich in fruits and vegetables, fat-free dairy products, and foods low in fat and salt (sodium) can help lower blood pressure. Decreasing salt intake decreases blood pressure in a 1/3 of people.  Stop smoking if you are a smoker. The steps above are highly effective in reducing blood pressure. While these actions are easy to suggest, they are difficult to achieve. Most patients with moderate or severe hypertension end up requiring medications to bring their blood pressure down to a normal level. There are several classes of medications for treatment. Blood pressure pills (antihypertensives) will lower blood pressure by their different actions. Lowering the blood pressure by 10 mmHg may decrease the risk of complications by as much as 25%. The goal of treatment is effective blood pressure control. This will reduce your risk for complications. Your caregiver will help you determine the  best treatment for you according to your lifestyle. What is excellent treatment for one person, may not be for you. HOME CARE INSTRUCTIONS   Do not smoke.  Follow the lifestyle changes outlined in the "Prevention" section.  If you are on medications, follow the directions carefully. Blood pressure medications must be taken as prescribed. Skipping doses reduces their benefit. It also puts you at risk for  problems.  Follow up with your caregiver, as directed.  If you are asked to monitor your blood pressure at home, follow the guidelines in the "Diagnosis" section above. SEEK MEDICAL CARE IF:   You think you are having medication side effects.  You have recurrent headaches or lightheadedness.  You have swelling in your ankles.  You have trouble with your vision. SEEK IMMEDIATE MEDICAL CARE IF:   You have sudden onset of chest pain or pressure, difficulty breathing, or other symptoms of a heart attack.  You have a severe headache.  You have symptoms of a stroke (such as sudden weakness, difficulty speaking, difficulty walking). MAKE SURE YOU:   Understand these instructions.  Will watch your condition.  Will get help right away if you are not doing well or get worse. Document Released: 06/09/2005 Document Revised: 09/01/2011 Document Reviewed: 01/07/2007 [facility] Patient Information 2014 [place], [place].

## 2013-09-19 NOTE — Telephone Encounter (Signed)
REviewed. Agree. cdm

## 2013-09-21 ENCOUNTER — Telehealth: Payer: Self-pay | Admitting: Cardiology

## 2013-09-21 MED ORDER — FLUTICASONE PROPIONATE 0.05 % EX CREA: TOPICAL_CREAM | Freq: Two times a day (BID) | CUTANEOUS | Status: DC

## 2013-09-21 NOTE — Telephone Encounter (Signed)
Recommend Fluticasone proprionate 0.05 topically twice a day. Needs enough for 7-10 days. This is a moderate potency steroid cream.  Ivonna Kinnick Swaz[place] MD, [facility]

## 2013-09-21 NOTE — Telephone Encounter (Signed)
Returned call to patient Dr.Jordan advised Fluticasone Proprionate 0.05 topically twice a day for 7 to 10 days.Advised to call back if not better.

## 2013-09-21 NOTE — Telephone Encounter (Signed)
Patient had procedure and ended up in the ER Sunday. He was told he had an reaction to the adhesive and they gave him cream. He still has irration an blisters, wants to know if something else can be called in? Please call and advise.  He uses [facility] [id number].

## 2013-09-21 NOTE — Addendum Note (Signed)
Addended by: Meda KlinefelterPUGH, CHERYL JOHNSON D on: 09/21/2013 05:24 PM   Modules accepted: Orders

## 2013-09-21 NOTE — Telephone Encounter (Signed)
Returned call to patient he stated he had a cardiac cath last Friday.Stated he removed dressing from left arm cath site on Saturday.Stated had redness and itching.[facility] was told use over the counter hydrocortisone cream.Stated hydrocortisone cream not helping,continues to have redness,itching,welps.Stated he has took a prescription steroid cream in the past that works better.Message sent to Dr.Jordan for advice.

## 2013-10-06 ENCOUNTER — Encounter: Payer: Self-pay | Admitting: Cardiology

## 2013-10-06 ENCOUNTER — Ambulatory Visit (INDEPENDENT_AMBULATORY_CARE_PROVIDER_SITE_OTHER): Payer: Medicare Other | Admitting: Cardiology

## 2013-10-06 VITALS — BP 148/80 | HR 82 | Ht 67.0 in | Wt 213.0 lb

## 2013-10-06 DIAGNOSIS — R079 Chest pain, unspecified: Secondary | ICD-10-CM

## 2013-10-06 DIAGNOSIS — Z951 Presence of aortocoronary bypass graft: Secondary | ICD-10-CM

## 2013-10-06 DIAGNOSIS — I2510 Atherosclerotic heart disease of native coronary artery without angina pectoris: Secondary | ICD-10-CM

## 2013-10-06 NOTE — Progress Notes (Signed)
Brandon LatusStephen Velez Date of Birth: 07/04/1950 Medical Record #[id number]  History of Present Illness: Brandon Velez is seen for follow up after recent cardiac cath. He is a 63 yo WM with history of CAD [place]. He has seen multiple cardiologists over the years and has had multiple cardiac cath procedures. He has continued to have chronic chest pain. Repeat caths (10) have demonstrated patent grafts and his pain was felt to be related to small vessel disease. He has been on aggressive antianginal therapy. He self referred himself to [facility]wohan [facility] and [facility] Jerrol BananaJoe Rogers [facility] for [facility] of transplant and was told he did not need this. He was seen by Trula Oreom Posvic [facility] for consideration of stem cell therapy but did not qualify for one of their trials. Recently he complained of increased chest pain in severity and duration. He had multiple other complaints including dizziness, sweating, and nausea. He did have [facility] which showed a moderate area of ischemia in the basal antero-septum. There was scar in the inferior and inferolateral wall.  He underwent repeat cardiac cath at our hospital recently with results noted below. Since then he has noted a significant improvement in his symptoms. He has been able to get out and do some yard work. He actually reduced his Ranexa to once a day and stopped taking Imdur. He still has a chronic 1-2/10 chest pain localized to the left lower parasternal area that is there constantly and has been present ever since his bypass surgery. Post cath he did develop a rash next to the access site in the left arm. This is improving.  Current Outpatient Prescriptions on File Prior to Visit  Medication Sig Dispense Refill  . amLODipine (NORVASC) 5 MG tablet Take 5 mg by mouth daily.      . benazepril (LOTENSIN) 40 MG tablet Take 40 mg by mouth daily.      . Canagliflozin (INVOKANA) 300 MG TABS Take 300 mg by mouth daily.       . cetirizine  (ZYRTEC) 10 MG tablet Take 10 mg by mouth daily.      . clopidogrel (PLAVIX) 75 MG tablet Take 75 mg by mouth daily with breakfast.      . famotidine (PEPCID) 40 MG tablet Take 40 mg by mouth daily with supper.       . fluticasone (CUTIVATE) 0.05 % cream Apply topically 2 (two) times daily.  30 g  1  . gabapentin (NEURONTIN) 300 MG capsule Take 300 mg by mouth at bedtime.       Marland Kitchen. glipiZIDE (GLUCOTROL XL) 10 MG 24 hr tablet Take 10 mg by mouth 2 (two) times daily.       . Insulin Glargine (LANTUS SOLOSTAR) 100 UNIT/ML Solostar Pen Inject 30 Units into the skin at bedtime.      . insulin lispro (HUMALOG KWIKPEN) 100 UNIT/ML KiwkPen Inject 0-16 Units into the skin 2 (two) times daily as needed (cbg >200).      . isosorbide mononitrate (IMDUR) 60 MG 24 hr tablet Take 60 mg by mouth daily.      . meclizine (ANTIVERT) 25 MG tablet Take 25 mg by mouth 3 (three) times daily as needed for dizziness.      . nitroGLYCERIN (NITROLINGUAL) 0.4 MG/SPRAY spray Place 1 spray under the tongue every 5 (five) minutes x 3 doses as needed for chest pain.      . ranolazine (RANEXA)  1000 MG SR tablet Take 500 mg by mouth 2 (two) times daily.       No current facility-administered medications on file prior to visit.    Allergies  Allergen Reactions  . Eggs Or Egg-Derived Products Diarrhea and Other (See Comments)    Upset stomach  . Other Diarrhea    Upset stomach, runny nose:  Food allergy:  Carrots, bananas, apples, beet sugar, filet of sole, lima beans, rice, eggs and many other foods that he cannot remember.  . Codeine Rash  . Penicillins Rash    Past Medical History  Diagnosis Date  . CAD (coronary artery disease)     s/p CABG 2004  . HTN (hypertension)   . Hyperlipidemia   . H/O splenectomy   . Diabetes mellitus     History reviewed. No pertinent past surgical history.  History  Smoking status  . Never Smoker   Smokeless tobacco  . Not on file    History  Alcohol Use No    Family  History  Problem Relation Age of Onset  . Heart attack Father     died 18 - 41 y/o  . Heart failure Father   . CAD Mother     s/p CABG 1996    Review of Systems: As noted in HPI.  All other systems were reviewed and are negative.  Physical Exam: BP 148/80  Pulse 82  Ht 5\' 7"  (1.702 m)  Wt 213 lb (96.616 kg)  BMI 33.35 kg/m2 He is a mildly overweight WM in NAD HEENT: [place]/AT, PERRL, EOMI, oropharynx is clear. Neck: supple without JVD, adenopathy, thyromegaly, bruits Lungs: clear CV: RRR. Normal S1-2, no gallop or murmur. Sternotomy scar has healed. Abd: soft, nontender BS+, no masses or HSM Ext: no edema, pulses 2+. Good radial pulse without hematoma. Mild rash lateral aspect left wrist  LABORATORY DATA: Cardiac Catheterization Procedure Note  Name: Brandon Velez  MRN: [id number]  DOB: August 31, 1950  Procedure: Left Heart Cath, Selective Coronary Angiography, SVG angiography, LIMA angiography, LV angiography  Indication: 63 yo WM with history of CAD s/p CABG presents with refractory class 3-4 angina despite optimal medical therapy.  Procedural Details: The left wrist was prepped, draped, and anesthetized with 1% lidocaine. Using the modified Seldinger technique, a 5 French sheath was introduced into the left radial artery. 3 mg of verapamil was administered through the sheath, weight-based unfractionated heparin was administered intravenously. Standard Judkins catheters were used for selective coronary angiography and left ventriculography. An AL1 catheter was used to engage the left coronary artery and SVG to the OM. An LCB catheter was used to engage the SVG to diagonal. An IMA catheter was used to access the LIMA graft. Catheter exchanges were performed over an exchange length guidewire. There were no immediate procedural complications. A TR band was used for radial hemostasis at the completion of the procedure. The patient was transferred to the post catheterization recovery area for  further monitoring.  Procedural Findings:  Hemodynamics:  AO 127/60 mean 87 mm Hg  LV 132/17 mm Hg  Coronary angiography:  Coronary dominance: right  Left mainstem: The left main tapers to 30% distally.  Left anterior descending (LAD): 100% ostial  Ramus intermediate: The ramus branch bifurcates proximally. The more anterior branch is moderate in size and bifurcates again in the mid vessel. The proximal portion of this branch is diffusely diseased up to 80-90%. There is a 90% true bifurcation stenosis in the mid vessel. The more lateral branch of the intermediate  is occluded.  Left circumflex (LCx): The LCx gives off 2 OM branches before terminating in a third OM. The LCx is diffusely diseased in the proximal to mid vessel up to 80-90%. This vessel is small in caliber. The first and second OMs are occluded.  Right coronary artery (RCA): The RCA has segmental 80% disease in the mid vessel. The distal vessel is occluded. There are bridging collaterals to the PLOM.  The SVG to the PDA is patent. There is 90% disease in the mid to distal PDA.  The SVG to the second OM is patent. There is retrograde filling of the first OM. The first OM is diffusely diseased to 40%.  The SVG to the diagonal is occluded.  The LIMA graft to the LAD is patent. This fills the entire LAD retrograde. The first diagonal is occluded with faint left to left collaterals. The LAD distal to the graft is small and diffusely diseased up to 80% at the apex.  Left ventriculography: Left ventricular systolic function is normal, LVEF is estimated at 55-65%, there is no significant mitral regurgitation  Final Conclusions:  1. Severe 3 vessel obstructive CAD  2. Patent LIMA to the LAD.  3. Patent SVG to the OM2  4. Patent SVG to the PDA  5. Occluded SVG to the diagonal.  6. Normal LV function.  Recommendations: Brandon Velez has significant small and distal vessel disease that is not readily amenable to intervention. 3/4 bypass grafts  are functioning well. The diagonal graft is occluded but the diagonal appears to be a very small branch. I will review with my interventional colleagues to consider options. Physiologic testing may be beneficial to assess degree and location of ischemia.  Brandon Velez [facility],FACC  09/16/2013, 8:27 AM    Assessment / Plan: 1. CAD s/p remote CABG. The patient has a good long term result with revascularization. He does have diffuse small vessel disease that is not amenable to intervention. [facility] showed only ischemia in the basal antero-septum consistent with distribution of the septal perforators. I think a lot of his chest pain isn't really anginal in nature, especially the constant pain that he has. This is consistent with the fact that he has reduced his anti-anginal therapy without increase in chest pain and actually feels better. One consideration for his chronic chest pain would be a Lidoderm patch but since his pain is so much better now we will hold off for now. He states he gets good relief now with Tylenol pm. We will continue his current therapy for now and I will follow up in 3 months.  2. Chronic chest pain  3. HTN  4. Hyperlipidemia  5. DM type 2  6. S/p splenectomy

## 2013-10-06 NOTE — Patient Instructions (Signed)
Continue your current therapy   I will see you in 3 months. 

## 2013-10-12 ENCOUNTER — Inpatient Hospital Stay (HOSPITAL_COMMUNITY): Payer: Medicare Other

## 2013-10-12 ENCOUNTER — Inpatient Hospital Stay (HOSPITAL_COMMUNITY)
Admission: EM | Admit: 2013-10-12 | Discharge: 2013-10-14 | DRG: 066 | Disposition: A | Discharge status: Home Health | Payer: Medicare Other | Attending: Family Medicine | Admitting: Family Medicine

## 2013-10-12 ENCOUNTER — Encounter (HOSPITAL_COMMUNITY): Payer: Self-pay | Admitting: *Deleted

## 2013-10-12 ENCOUNTER — Emergency Department (HOSPITAL_COMMUNITY): Payer: Medicare Other

## 2013-10-12 DIAGNOSIS — I639 Cerebral infarction, unspecified: Secondary | ICD-10-CM

## 2013-10-12 DIAGNOSIS — I10 Essential (primary) hypertension: Secondary | ICD-10-CM | POA: Diagnosis present

## 2013-10-12 DIAGNOSIS — Z79899 Other long term (current) drug therapy: Secondary | ICD-10-CM

## 2013-10-12 DIAGNOSIS — E785 Hyperlipidemia, unspecified: Secondary | ICD-10-CM | POA: Diagnosis present

## 2013-10-12 DIAGNOSIS — R4789 Other speech disturbances: Secondary | ICD-10-CM | POA: Diagnosis present

## 2013-10-12 DIAGNOSIS — E119 Type 2 diabetes mellitus without complications: Secondary | ICD-10-CM | POA: Diagnosis present

## 2013-10-12 DIAGNOSIS — R1310 Dysphagia, unspecified: Secondary | ICD-10-CM | POA: Diagnosis present

## 2013-10-12 DIAGNOSIS — Z951 Presence of aortocoronary bypass graft: Secondary | ICD-10-CM

## 2013-10-12 DIAGNOSIS — I6350 Cerebral infarction due to unspecified occlusion or stenosis of unspecified cerebral artery: Secondary | ICD-10-CM

## 2013-10-12 DIAGNOSIS — D72829 Elevated white blood cell count, unspecified: Secondary | ICD-10-CM | POA: Diagnosis present

## 2013-10-12 DIAGNOSIS — Z794 Long term (current) use of insulin: Secondary | ICD-10-CM

## 2013-10-12 DIAGNOSIS — R079 Chest pain, unspecified: Secondary | ICD-10-CM

## 2013-10-12 DIAGNOSIS — I6330 Cerebral infarction due to thrombosis of unspecified cerebral artery: Principal | ICD-10-CM | POA: Diagnosis present

## 2013-10-12 DIAGNOSIS — I2510 Atherosclerotic heart disease of native coronary artery without angina pectoris: Secondary | ICD-10-CM

## 2013-10-12 LAB — CBC
HEMATOCRIT: 47.1 % (ref 39.0–52.0)
HEMOGLOBIN: 16 g/dL (ref 13.0–17.0)
MCH: 31.2 pg (ref 26.0–34.0)
MCHC: 34 g/dL (ref 30.0–36.0)
MCV: 91.8 fL (ref 78.0–100.0)
Platelets: 411 10*3/uL — ABNORMAL HIGH (ref 150–400)
RBC: 5.13 MIL/uL (ref 4.22–5.81)
RDW: 14.9 % (ref 11.5–15.5)
WBC: 17.6 10*3/uL — AB (ref 4.0–10.5)

## 2013-10-12 LAB — GLUCOSE, CAPILLARY: Glucose-Capillary: 252 mg/dL — ABNORMAL HIGH (ref 70–99)

## 2013-10-12 LAB — PROTIME-INR
INR: 0.87 (ref 0.00–1.49)
Prothrombin Time: 11.7 seconds (ref 11.6–15.2)

## 2013-10-12 LAB — COMPREHENSIVE METABOLIC PANEL
ALT: 23 U/L (ref 0–53)
AST: 24 U/L (ref 0–37)
Albumin: 3.6 g/dL (ref 3.5–5.2)
Alkaline Phosphatase: 61 U/L (ref 39–117)
BUN: 18 mg/dL (ref 6–23)
CALCIUM: 9.3 mg/dL (ref 8.4–10.5)
CHLORIDE: 100 meq/L (ref 96–112)
CO2: 23 meq/L (ref 19–32)
CREATININE: 1.1 mg/dL (ref 0.50–1.35)
GFR, EST AFRICAN AMERICAN: 82 mL/min — AB (ref >90)
GFR, EST NON AFRICAN AMERICAN: 71 mL/min — AB (ref >90)
GLUCOSE: 336 mg/dL — AB (ref 70–99)
Potassium: 4.6 mEq/L (ref 3.7–5.3)
Sodium: 138 mEq/L (ref 137–147)
Total Protein: 7.7 g/dL (ref 6.0–8.3)

## 2013-10-12 LAB — I-STAT CHEM 8, ED
BUN: 19 mg/dL (ref 6–23)
CHLORIDE: 100 meq/L (ref 96–112)
Calcium, Ion: 1.18 mmol/L (ref 1.13–1.30)
Creatinine, Ser: 1.2 mg/dL (ref 0.50–1.35)
GLUCOSE: 337 mg/dL — AB (ref 70–99)
HCT: 50 % (ref 39.0–52.0)
Hemoglobin: 17 g/dL (ref 13.0–17.0)
Potassium: 4 mEq/L (ref 3.7–5.3)
Sodium: 138 mEq/L (ref 137–147)
TCO2: 24 mmol/L (ref 0–100)

## 2013-10-12 LAB — DIFFERENTIAL
Basophils Absolute: 0.1 10*3/uL (ref 0.0–0.1)
Basophils Relative: 1 % (ref 0–1)
Eosinophils Absolute: 0.4 10*3/uL (ref 0.0–0.7)
Eosinophils Relative: 2 % (ref 0–5)
LYMPHS PCT: 29 % (ref 12–46)
Lymphs Abs: 5.2 10*3/uL — ABNORMAL HIGH (ref 0.7–4.0)
MONO ABS: 1.3 10*3/uL — AB (ref 0.1–1.0)
Monocytes Relative: 7 % (ref 3–12)
Neutro Abs: 10.7 10*3/uL — ABNORMAL HIGH (ref 1.7–7.7)
Neutrophils Relative %: 61 % (ref 43–77)

## 2013-10-12 LAB — I-STAT TROPONIN, ED: Troponin i, poc: 0.01 ng/mL (ref 0.00–0.08)

## 2013-10-12 LAB — CBG MONITORING, ED: Glucose-Capillary: 356 mg/dL — ABNORMAL HIGH (ref 70–99)

## 2013-10-12 LAB — APTT: aPTT: 27 seconds (ref 24–37)

## 2013-10-12 MED ORDER — CANAGLIFLOZIN 300 MG PO TABS
300.0000 mg | ORAL_TABLET | Freq: Every day | ORAL | Status: DC
  Administered 2013-10-14: 300 mg via ORAL
  Filled 2013-10-12 (×2): qty 1

## 2013-10-12 MED ORDER — INSULIN GLARGINE 100 UNIT/ML ~~LOC~~ SOLN
30.0000 [IU] | Freq: Every day | SUBCUTANEOUS | Status: DC
  Administered 2013-10-13 (×2): 30 [IU] via SUBCUTANEOUS
  Filled 2013-10-12 (×3): qty 0.3

## 2013-10-12 MED ORDER — ISOSORBIDE MONONITRATE ER 60 MG PO TB24
60.0000 mg | ORAL_TABLET | Freq: Every day | ORAL | Status: DC
  Administered 2013-10-14: 60 mg via ORAL
  Filled 2013-10-12 (×2): qty 1

## 2013-10-12 MED ORDER — INSULIN ASPART 100 UNIT/ML ~~LOC~~ SOLN
0.0000 [IU] | Freq: Three times a day (TID) | SUBCUTANEOUS | Status: DC
Start: 2013-10-13 — End: 2013-10-14
  Administered 2013-10-13: 3 [IU] via SUBCUTANEOUS
  Administered 2013-10-14: 5 [IU] via SUBCUTANEOUS

## 2013-10-12 MED ORDER — METOCLOPRAMIDE HCL 5 MG/ML IJ SOLN
10.0000 mg | Freq: Once | INTRAMUSCULAR | Status: AC
Start: 2013-10-12 — End: 2013-10-12
  Administered 2013-10-12: 10 mg via INTRAVENOUS
  Filled 2013-10-12: qty 2

## 2013-10-12 MED ORDER — DIPHENHYDRAMINE HCL 50 MG/ML IJ SOLN
12.5000 mg | Freq: Once | INTRAMUSCULAR | Status: AC
  Administered 2013-10-12: 12.5 mg via INTRAVENOUS
  Filled 2013-10-12: qty 1

## 2013-10-12 MED ORDER — RANOLAZINE ER 500 MG PO TB12
500.0000 mg | ORAL_TABLET | Freq: Two times a day (BID) | ORAL | Status: DC
  Administered 2013-10-13 – 2013-10-14 (×2): 500 mg via ORAL
  Filled 2013-10-12 (×5): qty 1

## 2013-10-12 MED ORDER — MECLIZINE HCL 25 MG PO TABS
25.0000 mg | ORAL_TABLET | Freq: Three times a day (TID) | ORAL | Status: DC | PRN
  Filled 2013-10-12: qty 1

## 2013-10-12 MED ORDER — AMLODIPINE BESYLATE 5 MG PO TABS
5.0000 mg | ORAL_TABLET | Freq: Every day | ORAL | Status: DC
  Administered 2013-10-14: 5 mg via ORAL
  Filled 2013-10-12 (×2): qty 1

## 2013-10-12 MED ORDER — ENOXAPARIN SODIUM 40 MG/0.4ML ~~LOC~~ SOLN
40.0000 mg | Freq: Every day | SUBCUTANEOUS | Status: DC
  Administered 2013-10-14: 40 mg via SUBCUTANEOUS
  Filled 2013-10-12 (×2): qty 0.4

## 2013-10-12 MED ORDER — NITROGLYCERIN 0.4 MG/SPRAY TL SOLN: 1.0000 | Status: DC | PRN

## 2013-10-12 MED ORDER — NITROGLYCERIN 0.4 MG SL SUBL: 0.4000 mg | SUBLINGUAL_TABLET | SUBLINGUAL | Status: DC | PRN

## 2013-10-12 MED ORDER — GABAPENTIN 300 MG PO CAPS
300.0000 mg | ORAL_CAPSULE | Freq: Every day | ORAL | Status: DC
  Administered 2013-10-13: 300 mg via ORAL
  Filled 2013-10-12 (×3): qty 1

## 2013-10-12 MED ORDER — BENAZEPRIL HCL 40 MG PO TABS
40.0000 mg | ORAL_TABLET | Freq: Every day | ORAL | Status: DC
  Administered 2013-10-14: 40 mg via ORAL
  Filled 2013-10-12 (×2): qty 1

## 2013-10-12 MED ORDER — FAMOTIDINE 40 MG PO TABS
40.0000 mg | ORAL_TABLET | Freq: Every day | ORAL | Status: DC
  Filled 2013-10-12 (×2): qty 1

## 2013-10-12 MED ORDER — HYDROMORPHONE HCL PF 1 MG/ML IJ SOLN
1.0000 mg | INTRAMUSCULAR | Status: DC | PRN
  Administered 2013-10-12 – 2013-10-13 (×3): 1 mg via INTRAVENOUS
  Filled 2013-10-12 (×3): qty 1

## 2013-10-12 MED ORDER — CLOPIDOGREL BISULFATE 75 MG PO TABS
75.0000 mg | ORAL_TABLET | Freq: Every day | ORAL | Status: DC
  Administered 2013-10-14: 75 mg via ORAL
  Filled 2013-10-12 (×3): qty 1

## 2013-10-12 NOTE — ED Notes (Signed)
Pt has returned from MRI. 

## 2013-10-12 NOTE — Consult Note (Addendum)
Referring Physician: Patria Maneampos    Chief Complaint: Difficulty swallowing, difficulty with speech and left sided weakness  HPI: Brandon Velez is an 63 y.o. male with a history of CAD who reports that was with his wife earlier today and was doing well.  She left to go to work at [employer].  When she returned she noted slurred speech and that he was having difficulty moving the left.  The patient reported difficulty swallowing as well.  He reports that he was trying to swallow a drink and had difficulty getting it down.  Patient was brought in for evaluation at that time.  Code stroke was called in the ED.  Initial NIHSS of 2.   Patient also reports having a headache today.  It is better now than it was earlier in the day.  He describes it as sharp and on the right in the occipital area.  He rates his pain at a 5/10.    Date last known well: Date: 10/12/2013 Time last known well: Time: 13:15 tPA Given: No: Outside time window  Past Medical History  Diagnosis Date  . CAD (coronary artery disease)     s/p CABG 2004  . HTN (hypertension)   . Hyperlipidemia   . H/O splenectomy   . Diabetes mellitus     No past surgical history on file.  Family History  Problem Relation Age of Onset  . Heart attack Father     died [id number] 65- 62 y/o  . Heart failure Father   . CAD Mother     s/p CABG 1996   Social History:  reports that he has never smoked. He does not have any smokeless tobacco history on file. He reports that he does not drink alcohol. His drug history is not on file.  Allergies:  Allergies  Allergen Reactions  . Eggs Or Egg-Derived Products Diarrhea and Other (See Comments)    Upset stomach  . Other Diarrhea    Upset stomach, runny nose:  Food allergy:  Carrots, bananas, apples, beet sugar, filet of sole, lima beans, rice, eggs and many other foods that he cannot remember.  . Codeine Rash  . Penicillins Rash    Medications: I have reviewed the patient's current medications. Prior to  Admission:  Current outpatient prescriptions: amLODipine (NORVASC) 5 MG tablet, Take 5 mg by mouth daily., Disp: , Rfl: ;   benazepril (LOTENSIN) 40 MG tablet, Take 40 mg by mouth daily., Disp: , Rfl: ;   Canagliflozin (INVOKANA) 300 MG TABS, Take 300 mg by mouth daily. , Disp: , Rfl: ;   cetirizine (ZYRTEC) 10 MG tablet, Take 10 mg by mouth daily., Disp: , Rfl: ;  clopidogrel (PLAVIX) 75 MG tablet, Take 75 mg by mouth daily with breakfast., Disp: , Rfl:  famotidine (PEPCID) 40 MG tablet, Take 40 mg by mouth daily with supper. , Disp: , Rfl: ;   fluticasone (CUTIVATE) 0.05 % cream, Apply topically 2 (two) times daily., Disp: 30 g, Rfl: 1;   gabapentin (NEURONTIN) 300 MG capsule, Take 300 mg by mouth at bedtime. , Disp: , Rfl: ;   glipiZIDE (GLUCOTROL XL) 10 MG 24 hr tablet, Take 10 mg by mouth 2 (two) times daily. , Disp: , Rfl:  Insulin Glargine (LANTUS SOLOSTAR) 100 UNIT/ML Solostar Pen, Inject 30 Units into the skin at bedtime., Disp: , Rfl: ;   insulin lispro (HUMALOG KWIKPEN) 100 UNIT/ML KiwkPen, Inject 0-16 Units into the skin 2 (two) times daily as needed (cbg >200)., Disp: ,  Rfl: ;   isosorbide mononitrate (IMDUR) 60 MG 24 hr tablet, Take 60 mg by mouth daily., Disp: , Rfl:  meclizine (ANTIVERT) 25 MG tablet, Take 25 mg by mouth 3 (three) times daily as needed for dizziness., Disp: , Rfl: ;  nitroGLYCERIN (NITROLINGUAL) 0.4 MG/SPRAY spray, Place 1 spray under the tongue every 5 (five) minutes x 3 doses as needed for chest pain., Disp: , Rfl: ;   ranolazine (RANEXA) 1000 MG SR tablet, Take 500 mg by mouth 2 (two) times daily., Disp: , Rfl:   ROS: History obtained from the patient  General ROS: negative for - chills, fatigue, fever, night sweats, weight gain or weight loss Psychological ROS: negative for - behavioral disorder, hallucinations, memory difficulties, mood swings or suicidal ideation Ophthalmic ROS: negative for - blurry vision, double vision, eye pain or loss of vision ENT  ROS: as noted in HPI Allergy and Immunology ROS: negative for - hives or itchy/watery eyes Hematological and Lymphatic ROS: negative for - bleeding problems, bruising or swollen lymph nodes Endocrine ROS: negative for - galactorrhea, hair pattern changes, polydipsia/polyuria or temperature intolerance Respiratory ROS: negative for - cough, hemoptysis, shortness of breath or wheezing Cardiovascular ROS: recent chest pain Gastrointestinal ROS: negative for - abdominal pain, diarrhea, hematemesis, nausea/vomiting or stool incontinence Genito-Urinary ROS: negative for - dysuria, hematuria, incontinence or urinary frequency/urgency Musculoskeletal ROS: negative for - joint swelling or muscular weakness Neurological ROS: as noted in HPI Dermatological ROS: negative for rash and skin lesion changes  Physical Examination: Blood pressure 149/82, pulse 89, temperature 98 F (36.7 C), temperature source Temporal, resp. rate 18, height 5\' 7"  (1.702 m), weight 90.992 kg (200 lb 9.6 oz), SpO2 97.00%.  Neurologic Examination: Mental Status: Alert, oriented, thought content appropriate.  Speech fluent without evidence of aphasia.  Mild dysarthria noted.  Able to follow 3 step commands without difficulty. Cranial Nerves: II: Discs flat bilaterally; Visual fields grossly normal, pupils equal, round, reactive to light and accommodation III,IV, VI: ptosis not present, extra-ocular motions intact bilaterally V,VII: smile symmetric, facial light touch sensation normal bilaterally VIII: hearing normal bilaterally IX,X: gag reflex present bilaterally XI: bilateral shoulder shrug XII: midline tongue extension Motor: On lifting both upper extremities, he is able to maintain both against gravity and both fall to the bed with any resistance.  No pronator drift noted.  Patient has difficulty lifting the LLE and gives minimal reciprocal downward movement with the right.  5/5 strength noted in the RLE Sensory:  Pinprick and light touch intact throughout, bilaterally Deep Tendon Reflexes: 2+ throughout with absent AJ's bilaterally Plantars: Right: mute   Left: mute Cerebellar: normal finger-to-nose and normal heel-to-shin test Gait: unable to test CV: pulses palpable throughout     Laboratory Studies:  Basic Metabolic Panel:  Recent Labs Lab 10/12/13 1920  NA 138  K 4.0  CL 100  GLUCOSE 337*  BUN 19  CREATININE 1.20    Liver Function Tests: No results found for this basename: AST, ALT, ALKPHOS, BILITOT, PROT, ALBUMIN,  in the last 168 hours No results found for this basename: LIPASE, AMYLASE,  in the last 168 hours No results found for this basename: AMMONIA,  in the last 168 hours  CBC:  Recent Labs Lab 10/12/13 1920  HGB 17.0  HCT 50.0    Cardiac Enzymes: No results found for this basename: CKTOTAL, CKMB, CKMBINDEX, TROPONINI,  in the last 168 hours  BNP: No components found with this basename: POCBNP,   CBG:  Recent Labs Lab  10/12/13 1908  GLUCAP 356*    Microbiology: No results found for this or any previous visit.  Coagulation Studies: No results found for this basename: LABPROT, INR,  in the last 72 hours  Urinalysis: No results found for this basename: COLORURINE, APPERANCEUR, LABSPEC, PHURINE, GLUCOSEU, HGBUR, BILIRUBINUR, KETONESUR, PROTEINUR, UROBILINOGEN, NITRITE, LEUKOCYTESUR,  in the last 168 hours  Lipid Panel: No results found for this basename: chol,  trig,  hdl,  cholhdl,  vldl,  ldlcalc    HgbA1C:  No results found for this basename: HGBA1C    Urine Drug Screen:   No results found for this basename: labopia,  cocainscrnur,  labbenz,  amphetmu,  thcu,  labbarb    Alcohol Level: No results found for this basename: ETH,  in the last 168 hours  Other results: EKG: sinus rhythm at 74 bpm with first degree AV block  Imaging: Ct Head (brain) Wo Contrast  10/12/2013   CLINICAL DATA:  Code stroke  EXAM: CT HEAD WITHOUT CONTRAST   TECHNIQUE: Contiguous axial images were obtained from the base of the skull through the vertex without intravenous contrast.  COMPARISON:  None.  FINDINGS: No mass effect, midline shift, or acute intracranial hemorrhage. No suspicious low-density to suggest acute stroke. No evidence of hyperdense MCA. Cranium is intact. Mastoid air cells clear.  IMPRESSION: Negative head CT.   Electronically Signed   By: Maryclare BeanArt  Hoss M.D.   On: 10/12/2013 19:16    Assessment: 63 y.o. male presenting with difficulty with speech, difficulty swallowing and left sided weakness. NIHSS is low.  There are also multiple functional features noted on examination.  Head CT reviewed and shows no acute changes.  Patient is outside of the time window for tPA.  Not considered a candidate at this time.  Further work up recommended.    Stroke Risk Factors - diabetes mellitus, hyperlipidemia and hypertension  Plan: 1. HgbA1c, fasting lipid panel 2. MRI, MRA  of the brain without contrast 3. PT consult, OT consult, Speech consult 4. Echocardiogram 5. Carotid dopplers 6. Prophylactic therapy-Antiplatelet med: Aspirin - dose 325mg  daily 7. Risk factor modification 8. Telemetry monitoring 9. Frequent neuro checks  Case discussed with ED physician  Thana FarrLeslie Terrius Gentile, MD [facility] [phone] 10/12/2013, 7:44 PM

## 2013-10-12 NOTE — ED Notes (Signed)
Pt remains in MRI at this time  

## 2013-10-12 NOTE — ED Notes (Signed)
Advised by Rapid Response RN that we will be performing Vitals and Neuro checks every 2 hours.

## 2013-10-12 NOTE — ED Notes (Signed)
Patient transported to MRI 

## 2013-10-12 NOTE — ED Provider Notes (Signed)
CSN: [id number]     Arrival date & time 10/12/13  1846 History   First MD Initiated Contact with Patient 10/12/13 1854     Chief Complaint  Patient presents with  . Code Stroke    An emergency department physician performed an initial assessment on this suspected stroke patient at [id number]. (Consider location/radiation/quality/duration/timing/severity/associated sxs/prior Treatment) HPI  This is a 63 y.o. male with PMH of CAD status post CABG, hypertension, hyperlipidemia, diabetes, presenting as could stroke with weakness, dysarthria. Onset around 1300. Weakness located left face, left upper extremity, left lower extremity. Persistent, worsening. No medications taken. Negative for chest pain, shortness of breath, lightheadedness, dizziness, back pain, nausea, vomiting.  Patient denies headache.  Past Medical History  Diagnosis Date  . CAD (coronary artery disease)     s/p CABG 2004  . HTN (hypertension)   . Hyperlipidemia   . H/O splenectomy   . Diabetes mellitus    History reviewed. No pertinent past surgical history. Family History  Problem Relation Age of Onset  . Heart attack Father     died [id number] 50- 62 y/o  . Heart failure Father   . CAD Mother     s/p CABG 1996   History  Substance Use Topics  . Smoking status: Never Smoker   . Smokeless tobacco: Not on file  . Alcohol Use: No    Review of Systems  Constitutional: Negative for fever.  HENT: Negative for facial swelling.   Eyes: Negative for pain.  Respiratory: Negative for shortness of breath.   Cardiovascular: Negative for chest pain.  Gastrointestinal: Negative for nausea.  Genitourinary: Negative for dysuria.  Musculoskeletal: Negative for myalgias.  Neurological: Positive for weakness and headaches.  Psychiatric/Behavioral: Negative for behavioral problems.      Allergies  Eggs or egg-derived products; Other; Codeine; and Penicillins  Home Medications   Prior to Admission medications   Medication Sig  Start Date End Date Taking? Authorizing Provider  amLODipine (NORVASC) 5 MG tablet Take 5 mg by mouth daily.   Yes Historical Provider, MD  benazepril (LOTENSIN) 40 MG tablet Take 40 mg by mouth daily.   Yes Historical Provider, MD  Canagliflozin (INVOKANA) 300 MG TABS Take 300 mg by mouth daily.    Yes Historical Provider, MD  cetirizine (ZYRTEC) 10 MG tablet Take 10 mg by mouth daily.   Yes Historical Provider, MD  clopidogrel (PLAVIX) 75 MG tablet Take 75 mg by mouth daily with breakfast.   Yes Historical Provider, MD  famotidine (PEPCID) 40 MG tablet Take 40 mg by mouth daily with supper.    Yes Historical Provider, MD  fluticasone (CUTIVATE) 0.05 % cream Apply topically 2 (two) times daily. 09/21/13  Yes Peter M SwazilandJordan, MD  gabapentin (NEURONTIN) 300 MG capsule Take 300 mg by mouth at bedtime.    Yes Historical Provider, MD  glipiZIDE (GLUCOTROL XL) 10 MG 24 hr tablet Take 10 mg by mouth 2 (two) times daily.    Yes Historical Provider, MD  Insulin Glargine (LANTUS SOLOSTAR) 100 UNIT/ML Solostar Pen Inject 30 Units into the skin at bedtime.   Yes Historical Provider, MD  insulin lispro (HUMALOG KWIKPEN) 100 UNIT/ML KiwkPen Inject 0-16 Units into the skin 2 (two) times daily as needed (cbg >200).   Yes Historical Provider, MD  isosorbide mononitrate (IMDUR) 60 MG 24 hr tablet Take 60 mg by mouth daily.   Yes Historical Provider, MD  meclizine (ANTIVERT) 25 MG tablet Take 25 mg by mouth 3 (three) times daily as  needed for dizziness.   Yes Historical Provider, MD  nitroGLYCERIN (NITROLINGUAL) 0.4 MG/SPRAY spray Place 1 spray under the tongue every 5 (five) minutes x 3 doses as needed for chest pain.   Yes Historical Provider, MD  ranolazine (RANEXA) 1000 MG SR tablet Take 500 mg by mouth 2 (two) times daily.   Yes Historical Provider, MD   BP 155/73  Pulse 75  Temp(Src) 97.8 F (36.6 C) (Oral)  Resp 18  Ht 5\' 7"  (1.702 m)  Wt 208 lb (94.348 kg)  BMI 32.57 kg/m2  SpO2 96% Physical Exam   Constitutional: He is oriented to person, place, and time. He appears well-developed and well-nourished. No distress.  HENT:  Head: Normocephalic and atraumatic.  Mouth/Throat: No oropharyngeal exudate.  Eyes: Conjunctivae are normal. Pupils are equal, round, and reactive to light. No scleral icterus.  Neck: Normal range of motion. No tracheal deviation present. No thyromegaly present.  Cardiovascular: Normal rate, regular rhythm and normal heart sounds.  Exam reveals no gallop and no friction rub.   No murmur heard. Pulmonary/Chest: Effort normal and breath sounds normal. No stridor. No respiratory distress. He has no wheezes. He has no rales. He exhibits no tenderness.  Abdominal: Soft. He exhibits no distension and no mass. There is no tenderness. There is no rebound and no guarding.  Musculoskeletal: Normal range of motion. He exhibits no edema.  Neurological: He is alert and oriented to person, place, and time. GCS eye subscore is 4. GCS verbal subscore is 5. GCS motor subscore is 6.  Positive for left-sided facial droop Positive for left-sided upper and lower extremity weakness  Positive for dysarthria  Skin: Skin is warm and dry. He is not diaphoretic.    ED Course  Procedures (including critical care time) Labs Review Labs Reviewed  CBC - Abnormal; Notable for the following:    WBC 17.6 (*)    Platelets 411 (*)    All other components within normal limits  DIFFERENTIAL - Abnormal; Notable for the following:    Neutro Abs 10.7 (*)    Lymphs Abs 5.2 (*)    Monocytes Absolute 1.3 (*)    All other components within normal limits  COMPREHENSIVE METABOLIC PANEL - Abnormal; Notable for the following:    Glucose, Bld 336 (*)    Total Bilirubin <0.2 (*)    GFR calc non Af Amer 71 (*)    GFR calc Af Amer 82 (*)    All other components within normal limits  CBG MONITORING, ED - Abnormal; Notable for the following:    Glucose-Capillary 356 (*)    All other components within  normal limits  I-STAT CHEM 8, ED - Abnormal; Notable for the following:    Glucose, Bld 337 (*)    All other components within normal limits  PROTIME-INR  APTT  URINALYSIS, ROUTINE W REFLEX MICROSCOPIC  HEMOGLOBIN A1C  LIPID PANEL  CBC  BASIC METABOLIC PANEL  I-STAT TROPOININ, ED    Imaging Review Ct Head (brain) Wo Contrast  10/12/2013   CLINICAL DATA:  Code stroke  EXAM: CT HEAD WITHOUT CONTRAST  TECHNIQUE: Contiguous axial images were obtained from the base of the skull through the vertex without intravenous contrast.  COMPARISON:  None.  FINDINGS: No mass effect, midline shift, or acute intracranial hemorrhage. No suspicious low-density to suggest acute stroke. No evidence of hyperdense MCA. Cranium is intact. Mastoid air cells clear.  IMPRESSION: Negative head CT.   Electronically Signed   By: Mia Creek.D.  On: 10/12/2013 19:16   Mr Maxine Glenn Head Wo Contrast  10/12/2013   CLINICAL DATA:  Left-sided weakness, stroke  EXAM: MRI HEAD WITHOUT CONTRAST  MRA HEAD WITHOUT CONTRAST  TECHNIQUE: Multiplanar, multiecho pulse sequences of the brain and surrounding structures were obtained without intravenous contrast. Angiographic images of the head were obtained using MRA technique without contrast.  COMPARISON:  Prior CT from earlier the same day  FINDINGS: MRI HEAD FINDINGS  The CSF containing spaces are within normal limits for patient age. Mild scattered and confluent T2/FLAIR hyperintensity within the periventricular and deep white matter of the cerebral hemispheres present, nonspecific, but likely related to mild chronic microvascular ischemic disease. No mass lesion, midline shift, or extra-axial fluid collection. Ventricles are normal in size without evidence of hydrocephalus. Subcentimeter hypodensity seen within the left parietal lobe on SWI sequence is most compatible with a small chronic microhemorrhage.  6 mm linear oblique signal intensity seen within the right posterolateral aspect of the  brainstem on axial DWI sequences, which may be artifactual in nature (series 5, image 9). There is question of focal hypointense signal intensity within this region on the corresponding ADC map. No other foci of abnormal restricted diffusion seen. Gray-white matter differentiation is maintained. Normal flow voids are seen within the intracranial vasculature. No intracranial hemorrhage identified.  The cervicomedullary junction is normal. Pituitary gland is within normal limits. Pituitary stalk is midline. The globes and optic nerves demonstrate a normal appearance with normal signal intensity. Sequelae of prior lens implant noted at the right globe.  The bone marrow signal intensity is normal. Calvarium is intact. Visualized upper cervical spine is within normal limits.  Scalp soft tissues are unremarkable.  Mild polypoid T2 hyperintensity seen within the partially visualized left maxillary sinus. The paranasal sinuses are otherwise clear. No mastoid effusion. No mastoid effusion.  MRA HEAD FINDINGS  Anterior circulation: Normal flow related enhancement of the included cervical, petrous, cavernous and supra clinoid internal carotid arteries. Patent anterior communicating artery. Multi focal irregularity seen within the M1 segments bilaterally, left greater than right without associated high-grade flow-limiting stenosis, compatible with underlying atherosclerotic disease. Focal high-grade stenosis of at least 50% is seen within the distal left M2 segment, 4 mm in length (series 203, image 10).  Posterior circulation: Right Vertebral artery is dominant. The distal left vertebral artery is markedly diminutive. Basilar artery is patent, with normal flow related enhancement of the main branch vessels. Multi focal irregularity is seen within the P2 segments bilaterally, right greater than left, without definite high-grade flow-limiting stenosis. Fetal origin of the right posterior cerebral artery noted.  No large vessel  occlusion, hemodynamically significant stenosis, abnormal luminal irregularity, aneurysm within the anterior nor posterior circulation.  IMPRESSION: MRI BRAIN:  1. 6 mm linear focus of mildly increased signal intensity on DWI sequence involving the right posterior aspect of the brainstem as above. While this finding may be artifactual in nature, possible acute/subacute ischemia involving this region is not entirely excluded. Correlation with symptoms recommended. 2. No other acute intracranial infarct or other abnormality identified. 3. Mild age-related atrophy with chronic microvascular ischemic disease involving the supratentorial white matter.  MRA BRAIN:  1. No high-grade proximal branch occlusion identified within the intracranial circulation. 2. Multi focal atherosclerotic irregularity involving the M1 segments bilaterally, left greater than right, without high-grade flow-limiting stenosis. 3. Focal short segment 4 mm stenosis of at least 50% involving the left M2 segment as above. The left MCA branches are opacified distally. 4. Multi focal atherosclerotic  irregularity involving the P2 segments bilaterally, right greater than left, without definite high-grade flow-limiting stenosis. 5. Fetal origin of the right posterior cerebral artery.   Electronically Signed   By: Rise MuBenjamin  McClintock M.D.   On: 10/12/2013 23:15   Mr Brain Wo Contrast  10/12/2013   CLINICAL DATA:  Left-sided weakness, stroke  EXAM: MRI HEAD WITHOUT CONTRAST  MRA HEAD WITHOUT CONTRAST  TECHNIQUE: Multiplanar, multiecho pulse sequences of the brain and surrounding structures were obtained without intravenous contrast. Angiographic images of the head were obtained using MRA technique without contrast.  COMPARISON:  Prior CT from earlier the same day  FINDINGS: MRI HEAD FINDINGS  The CSF containing spaces are within normal limits for patient age. Mild scattered and confluent T2/FLAIR hyperintensity within the periventricular and deep white  matter of the cerebral hemispheres present, nonspecific, but likely related to mild chronic microvascular ischemic disease. No mass lesion, midline shift, or extra-axial fluid collection. Ventricles are normal in size without evidence of hydrocephalus. Subcentimeter hypodensity seen within the left parietal lobe on SWI sequence is most compatible with a small chronic microhemorrhage.  6 mm linear oblique signal intensity seen within the right posterolateral aspect of the brainstem on axial DWI sequences, which may be artifactual in nature (series 5, image 9). There is question of focal hypointense signal intensity within this region on the corresponding ADC map. No other foci of abnormal restricted diffusion seen. Gray-white matter differentiation is maintained. Normal flow voids are seen within the intracranial vasculature. No intracranial hemorrhage identified.  The cervicomedullary junction is normal. Pituitary gland is within normal limits. Pituitary stalk is midline. The globes and optic nerves demonstrate a normal appearance with normal signal intensity. Sequelae of prior lens implant noted at the right globe.  The bone marrow signal intensity is normal. Calvarium is intact. Visualized upper cervical spine is within normal limits.  Scalp soft tissues are unremarkable.  Mild polypoid T2 hyperintensity seen within the partially visualized left maxillary sinus. The paranasal sinuses are otherwise clear. No mastoid effusion. No mastoid effusion.  MRA HEAD FINDINGS  Anterior circulation: Normal flow related enhancement of the included cervical, petrous, cavernous and supra clinoid internal carotid arteries. Patent anterior communicating artery. Multi focal irregularity seen within the M1 segments bilaterally, left greater than right without associated high-grade flow-limiting stenosis, compatible with underlying atherosclerotic disease. Focal high-grade stenosis of at least 50% is seen within the distal left M2  segment, 4 mm in length (series 203, image 10).  Posterior circulation: Right Vertebral artery is dominant. The distal left vertebral artery is markedly diminutive. Basilar artery is patent, with normal flow related enhancement of the main branch vessels. Multi focal irregularity is seen within the P2 segments bilaterally, right greater than left, without definite high-grade flow-limiting stenosis. Fetal origin of the right posterior cerebral artery noted.  No large vessel occlusion, hemodynamically significant stenosis, abnormal luminal irregularity, aneurysm within the anterior nor posterior circulation.  IMPRESSION: MRI BRAIN:  1. 6 mm linear focus of mildly increased signal intensity on DWI sequence involving the right posterior aspect of the brainstem as above. While this finding may be artifactual in nature, possible acute/subacute ischemia involving this region is not entirely excluded. Correlation with symptoms recommended. 2. No other acute intracranial infarct or other abnormality identified. 3. Mild age-related atrophy with chronic microvascular ischemic disease involving the supratentorial white matter.  MRA BRAIN:  1. No high-grade proximal branch occlusion identified within the intracranial circulation. 2. Multi focal atherosclerotic irregularity involving the M1 segments bilaterally, left  greater than right, without high-grade flow-limiting stenosis. 3. Focal short segment 4 mm stenosis of at least 50% involving the left M2 segment as above. The left MCA branches are opacified distally. 4. Multi focal atherosclerotic irregularity involving the P2 segments bilaterally, right greater than left, without definite high-grade flow-limiting stenosis. 5. Fetal origin of the right posterior cerebral artery.   Electronically Signed   By: Rise Mu M.D.   On: 10/12/2013 23:15    MDM   Final diagnoses:  Stroke  Chest pain  Coronary atherosclerosis of native coronary artery    This is a 63  y.o. male with PMH of CAD status post CABG, hypertension, hyperlipidemia, diabetes, presenting as could stroke with weakness, dysarthria. Onset around 1300. Weakness located left face, left upper extremity, left lower extremity. Persistent, worsening. No medications taken. Negative for chest pain, shortness of breath, lightheadedness, dizziness, back pain, nausea, vomiting.  Patient denies headache.   Date: 10/13/2013  Rate: 87  Rhythm: normal sinus rhythm  QRS Axis: normal  Intervals: PR prolonged  ST/T Wave abnormalities: nonspecific T wave changes  Conduction Disutrbances:none  Narrative Interpretation: NSR with PR prolongation, non-specific T wave changes  Old EKG Reviewed: changes noted  Exam as above, with stable vital signs. There significant deficits, neurologic exam. I briefly assessed the airway and instructed the patient to be taken to the CT scanner immediately. We attempted to measure the blood glucose while in the scanner, but suffered a machine malfunction. After the scanner, blood glucose was obtained and was in the 300s. Neurology present at bedside. They state that, as patient is not actually appreciate his dysarthria right now, we cannot fully rule out on his account that symptoms started shortly after 1600. As the patient's eye complaints started at 1300, we are working with that as a time of onset. I believe this to be appropriate. Although CT scan of the head is negative, will not administer TPA at this time as he is not a candidate neurology is placed ferrous orders and recommends admission to hospitalist service.  I discussed the case with hospitalist who is to admit the patient.  Patient is being admitted in stable condition.  I have discussed case and care has been guided by my attending physician, Dr. Patria Mane.     Loma Boston, MD 10/13/13 [phone]

## 2013-10-12 NOTE — ED Notes (Signed)
Patient remains in MRI at this time.

## 2013-10-12 NOTE — ED Notes (Signed)
The pt states he woke this am with a headache. Wife came home to have lunch with the pt and he complained that the headache was worse but he was acting normal when she left to go back to work at 1315. TOnight when she got home from work she found him with slurred speech, L sided facial droop, and L arm weakness. The pt c/o trouble swallowing and 'tingling' in his L arm also. He is A&Ox4 now.

## 2013-10-12 NOTE — H&P (Signed)
[facility] History and Physical  Brandon Velez XWR:[id number] DOB: [birth year] DOA: 10/12/2013  Referring physician: ED physician PCP: Malka So., MD   Chief Complaint:   HPI:  63 y.o. male with a history of CAD, HTN, HLD, insulin dependent DM, presented to [facility] ED with main concern of sudden onset slurred speech that his wife noted when she returned home from work. Pt reports not noticing any speech difficulty but has some difficulty with swallowing. He denies chest pain or shortness of breath, no abdominal or urinary concerns, no fevers, chills, no similar events in the past. In ED, code stroke called, NIHSS 2, neurology consulted and [facility] asked to admit for stroke work up.   Assessment and Plan: Active Problems:   Stroke - admit to telemetry bed - MRI brain for further evaluation, 2 D ECHO, carotid doppler  - lipid panel and A1C ordered  - PT/OT/SLP - advance diet as pt able to tolerate - risk factor control HTN, HLD, diabetes - appreciate neurology input    HTN - continue Imdur, Lisinopril, Norvasc   DM - will check A1C - will place on home dose Insulin regimen - will also place on SSI   HLD - check lipid panel and continue statin   Radiological Exams on Admission: Ct Head (brain) Wo Contrast   10/12/2013   Negative head CT.   Electronically Signed   By: Maryclare Bean M.D.   On: 10/12/2013 19:16   Code Status: Full Family Communication: Pt at bedside Disposition Plan: Admit for further evaluation     Review of Systems:  Constitutional: Negative for fever, chills and malaise/fatigue. Negative for diaphoresis.  HENT: Negative for hearing loss, ear pain, nosebleeds, congestion, sore throat, neck pain, tinnitus and ear discharge.   Eyes: Negative for blurred vision, double vision, photophobia, pain, discharge and redness.  Respiratory: Negative for cough, hemoptysis, sputum production, shortness of breath, wheezing and stridor.   Cardiovascular: Negative for chest  pain, palpitations, orthopnea, claudication and leg swelling.  Gastrointestinal: Negative for nausea, vomiting and abdominal pain. Negative for heartburn, constipation, blood in stool and melena.  Genitourinary: Negative for dysuria, urgency, frequency, hematuria and flank pain.  Musculoskeletal: Negative for myalgias, back pain, joint pain and falls.  Skin: Negative for itching and rash.  Neurological: Negative for dizziness and weakness. Negative for tingling, tremors, sensory change.  Endo/Heme/Allergies: Negative for environmental allergies and polydipsia. Does not bruise/bleed easily.  Psychiatric/Behavioral: Negative for suicidal ideas. The patient is not nervous/anxious.      Past Medical History  Diagnosis Date  . CAD (coronary artery disease)     s/p CABG 2004  . HTN (hypertension)   . Hyperlipidemia   . H/O splenectomy   . Diabetes mellitus     No past surgical history on file.  Social History:  reports that he has never smoked. He does not have any smokeless tobacco history on file. He reports that he does not drink alcohol. His drug history is not on file.  Allergies  Allergen Reactions  . Eggs Or Egg-Derived Products Diarrhea and Other (See Comments)    Upset stomach  . Other Diarrhea    Upset stomach, runny nose:  Food allergy:  Carrots, bananas, apples, beet sugar, filet of sole, lima beans, rice, eggs and many other foods that he cannot remember.  . Codeine Rash  . Penicillins Rash    Family History  Problem Relation Age of Onset  . Heart attack Father     died 61 -  80 y/o  . Heart failure Father   . CAD Mother     s/p CABG 1996    Prior to Admission medications   Medication Sig Start Date End Date Taking? Authorizing Provider  amLODipine (NORVASC) 5 MG tablet Take 5 mg by mouth daily.   Yes Historical Provider, MD  benazepril (LOTENSIN) 40 MG tablet Take 40 mg by mouth daily.   Yes Historical Provider, MD  Canagliflozin (INVOKANA) 300 MG TABS Take 300  mg by mouth daily.    Yes Historical Provider, MD  cetirizine (ZYRTEC) 10 MG tablet Take 10 mg by mouth daily.   Yes Historical Provider, MD  clopidogrel (PLAVIX) 75 MG tablet Take 75 mg by mouth daily with breakfast.   Yes Historical Provider, MD  famotidine (PEPCID) 40 MG tablet Take 40 mg by mouth daily with supper.    Yes Historical Provider, MD  fluticasone (CUTIVATE) 0.05 % cream Apply topically 2 (two) times daily. 09/21/13  Yes Peter M [place], MD  gabapentin (NEURONTIN) 300 MG capsule Take 300 mg by mouth at bedtime.    Yes Historical Provider, MD  glipiZIDE (GLUCOTROL XL) 10 MG 24 hr tablet Take 10 mg by mouth 2 (two) times daily.    Yes Historical Provider, MD  Insulin Glargine (LANTUS SOLOSTAR) 100 UNIT/ML Solostar Pen Inject 30 Units into the skin at bedtime.   Yes Historical Provider, MD  insulin lispro (HUMALOG KWIKPEN) 100 UNIT/ML KiwkPen Inject 0-16 Units into the skin 2 (two) times daily as needed (cbg >200).   Yes Historical Provider, MD  isosorbide mononitrate (IMDUR) 60 MG 24 hr tablet Take 60 mg by mouth daily.   Yes Historical Provider, MD  meclizine (ANTIVERT) 25 MG tablet Take 25 mg by mouth 3 (three) times daily as needed for dizziness.   Yes Historical Provider, MD  nitroGLYCERIN (NITROLINGUAL) 0.4 MG/SPRAY spray Place 1 spray under the tongue every 5 (five) minutes x 3 doses as needed for chest pain.   Yes Historical Provider, MD  ranolazine (RANEXA) 1000 MG SR tablet Take 500 mg by mouth 2 (two) times daily.   Yes Historical Provider, MD    Physical Exam: Filed Vitals:   10/12/13 1850 10/12/13 1909 10/12/13 1917 10/12/13 1948  BP: 171/84 149/82  157/70  Pulse: 104 89  77  Temp: 98.2 F (36.8 C) 98 F (36.7 C)    TempSrc: Oral Temporal    Resp: 18 18  19   Height:   5\' 7"  (1.702 m)   Weight:  90.992 kg (200 lb 9.6 oz)    SpO2: 97% 97%  95%    Physical Exam  Constitutional: Appears well-developed and well-nourished. No distress.  HENT: Normocephalic. External  right and left ear normal. Oropharynx is clear and moist.  Eyes: Conjunctivae and EOM are normal. PERRLA, no scleral icterus.  Neck: Normal ROM. Neck supple. No JVD. No tracheal deviation. No thyromegaly.  CVS: RRR, S1/S2 +, no murmurs, no gallops, no carotid bruit.  Pulmonary: Effort and breath sounds normal, no stridor, rhonchi, wheezes, rales.  Abdominal: Soft. BS +,  no distension, tenderness, rebound or guarding.  Musculoskeletal: Normal range of motion. No edema and no tenderness.  Lymphadenopathy: No lymphadenopathy noted, cervical, inguinal. Neuro: Alert. Normal reflexes, muscle tone coordination.  Skin: Skin is warm and dry. No rash noted. Not diaphoretic. No erythema. No pallor.  Psychiatric: Normal mood and affect. Behavior, judgment, thought content normal.   Labs on Admission:  Basic Metabolic Panel:  Recent Labs Lab 10/12/13 1920  NA 138  K 4.0  CL 100  GLUCOSE 337*  BUN 19  CREATININE 1.20   CBC:  Recent Labs Lab 10/12/13 1920  HGB 17.0  HCT 50.0   CBG:  Recent Labs Lab 10/12/13 1908  GLUCAP 356*    EKG: Normal sinus rhythm, no ST/T wave changes  Dorothea Ogle, MD  [facility] Pager [phone]  If 7PM-7AM, please contact night-coverage [email or web address] Password [facility] 10/12/2013, 8:00 PM

## 2013-10-12 NOTE — ED Notes (Addendum)
Pt reports his last known well was at 1300 today.

## 2013-10-13 ENCOUNTER — Inpatient Hospital Stay (HOSPITAL_COMMUNITY): Payer: Medicare Other

## 2013-10-13 DIAGNOSIS — E119 Type 2 diabetes mellitus without complications: Secondary | ICD-10-CM

## 2013-10-13 DIAGNOSIS — I10 Essential (primary) hypertension: Secondary | ICD-10-CM

## 2013-10-13 DIAGNOSIS — I517 Cardiomegaly: Secondary | ICD-10-CM

## 2013-10-13 DIAGNOSIS — E785 Hyperlipidemia, unspecified: Secondary | ICD-10-CM

## 2013-10-13 LAB — LIPID PANEL
Cholesterol: 199 mg/dL (ref 0–200)
HDL: 29 mg/dL — AB (ref >39)
LDL CALC: 119 mg/dL — AB (ref 0–99)
TRIGLYCERIDES: 254 mg/dL — AB (ref <150)
Total CHOL/HDL Ratio: 6.9 RATIO
VLDL: 51 mg/dL — ABNORMAL HIGH (ref 0–40)

## 2013-10-13 LAB — CBC
HEMATOCRIT: 46.8 % (ref 39.0–52.0)
HEMOGLOBIN: 16 g/dL (ref 13.0–17.0)
MCH: 31.4 pg (ref 26.0–34.0)
MCHC: 34.2 g/dL (ref 30.0–36.0)
MCV: 91.9 fL (ref 78.0–100.0)
Platelets: 421 10*3/uL — ABNORMAL HIGH (ref 150–400)
RBC: 5.09 MIL/uL (ref 4.22–5.81)
RDW: 15.3 % (ref 11.5–15.5)
WBC: 16.8 10*3/uL — AB (ref 4.0–10.5)

## 2013-10-13 LAB — URINALYSIS, ROUTINE W REFLEX MICROSCOPIC
Bilirubin Urine: NEGATIVE
HGB URINE DIPSTICK: NEGATIVE
Ketones, ur: NEGATIVE mg/dL
Leukocytes, UA: NEGATIVE
Nitrite: NEGATIVE
PH: 6 (ref 5.0–8.0)
Protein, ur: NEGATIVE mg/dL
Specific Gravity, Urine: 1.015 (ref 1.005–1.030)
Urobilinogen, UA: 1 mg/dL (ref 0.0–1.0)

## 2013-10-13 LAB — HEMOGLOBIN A1C
HEMOGLOBIN A1C: 8.4 % — AB (ref <5.7)
MEAN PLASMA GLUCOSE: 194 mg/dL — AB (ref <117)

## 2013-10-13 LAB — GLUCOSE, CAPILLARY
GLUCOSE-CAPILLARY: 201 mg/dL — AB (ref 70–99)
GLUCOSE-CAPILLARY: 175 mg/dL — AB (ref 70–99)
Glucose-Capillary: 200 mg/dL — ABNORMAL HIGH (ref 70–99)
Glucose-Capillary: 217 mg/dL — ABNORMAL HIGH (ref 70–99)

## 2013-10-13 LAB — BASIC METABOLIC PANEL
BUN: 15 mg/dL (ref 6–23)
CHLORIDE: 104 meq/L (ref 96–112)
CO2: 22 meq/L (ref 19–32)
CREATININE: 1.01 mg/dL (ref 0.50–1.35)
Calcium: 9.4 mg/dL (ref 8.4–10.5)
GFR calc non Af Amer: 79 mL/min — ABNORMAL LOW (ref >90)
GLUCOSE: 195 mg/dL — AB (ref 70–99)
POTASSIUM: 4.6 meq/L (ref 3.7–5.3)
Sodium: 140 mEq/L (ref 137–147)

## 2013-10-13 LAB — URINE MICROSCOPIC-ADD ON

## 2013-10-13 MED ORDER — ONDANSETRON HCL 4 MG/2ML IJ SOLN: 4.0000 mg | Freq: Four times a day (QID) | INTRAMUSCULAR | Status: DC | PRN

## 2013-10-13 MED ORDER — ONDANSETRON HCL 4 MG/2ML IJ SOLN
4.0000 mg | Freq: Four times a day (QID) | INTRAMUSCULAR | Status: DC | PRN
  Filled 2013-10-13: qty 2

## 2013-10-13 MED ORDER — PERFLUTREN LIPID MICROSPHERE
1.0000 mL | INTRAVENOUS | Status: AC | PRN
  Administered 2013-10-13: 2 mL via INTRAVENOUS
  Filled 2013-10-13: qty 10

## 2013-10-13 MED ORDER — SODIUM CHLORIDE 0.9 % IV SOLN
Freq: Once | INTRAVENOUS | Status: AC
  Administered 2013-10-13: 11:00:00 via INTRAVENOUS

## 2013-10-13 MED ORDER — ONDANSETRON HCL 4 MG/2ML IJ SOLN
4.0000 mg | Freq: Four times a day (QID) | INTRAMUSCULAR | Status: DC | PRN
  Administered 2013-10-13 (×2): 4 mg via INTRAVENOUS
  Filled 2013-10-13: qty 2

## 2013-10-13 NOTE — Progress Notes (Signed)
[facility] PROGRESS NOTE  Brandon Velez ZOX:[id number] DOB: 1950-10-26 DOA: 10/12/2013 PCP: Malka So., MD   Assessment/Plan:  ? CVA- MRI brain shows 6 mm linear focus of mildly increased signal intensity on DWI  sequence involving the right posterior aspect of the brainstem as  above. While this finding may be artifactual in nature, possible  acute/subacute ischemia involving this region is not entirely excluded.  Continue with plavix Echo, carotid duplex are negative Awaiting neurology input.  HTN  - continue Imdur, Lisinopril, Norvasc   DM  - will check A1C  - will place on home dose Insulin regimen  - will also place on SSI   HLD  - check lipid panel and continue statin    Code Status: Full code Family Communication: Discussed with wife at the bedside Disposition Plan: Home when stable   Consultants:  Neurology   Procedures:  Carotid duplex  Antibiotics: None  HPI/Subjective: 63 y.o. male with a history of CAD, HTN, HLD, insulin dependent DM, presented to [facility] ED with main concern of sudden onset slurred speech that his wife noted when she returned home from work. Pt reports not noticing any speech difficulty but has some difficulty with swallowing. He denies chest pain or shortness of breath, no abdominal or urinary concerns, no fevers, chills, no similar events in the past. In ED, code stroke called, NIHSS 2, neurology consulted and [facility] asked to admit for stroke work up Patient says that the weakness has improved. MRI shows possible ischemic stroke in brainstem.  Objective: Filed Vitals:   10/13/13 1412  BP: 168/75  Pulse: 67  Temp: 98 F (36.7 C)  Resp: 20    Intake/Output Summary (Last 24 hours) at 10/13/13 1610 Last data filed at 10/13/13 1059  Gross per 24 hour  Intake     75 ml  Output      0 ml  Net     75 ml   Filed Weights   10/12/13 1909 10/12/13 2317  Weight: 90.992 kg (200 lb 9.6 oz) 94.348 kg (208 lb)    Exam:  Physical  Exam: Head: Normocephalic, atraumatic.  Eyes: No signs of jaundice, EOMI Nose: Mucous membranes dry.  Throat: Oropharynx nonerythematous, no exudate appreciated.  Neck: supple,No deformities, masses, or tenderness noted. Lungs: Normal respiratory effort. B/L Clear to auscultation, no crackles or wheezes.  Heart: Regular RR. S1 and S2 normal  Abdomen: BS normoactive. Soft, Nondistended, non-tender.  Extremities: No pretibial edema, no erythema   Data Reviewed: Basic Metabolic Panel:  Recent Labs Lab 10/12/13 1920 10/12/13 1942 10/13/13 0534  NA 138 138 140  K 4.0 4.6 4.6  CL 100 100 104  CO2  --  23 22  GLUCOSE 337* 336* 195*  BUN 19 18 15   CREATININE 1.20 1.10 1.01  CALCIUM  --  9.3 9.4   Liver Function Tests:  Recent Labs Lab 10/12/13 1942  AST 24  ALT 23  ALKPHOS 61  BILITOT <0.2*  PROT 7.7  ALBUMIN 3.6   No results found for this basename: LIPASE, AMYLASE,  in the last 168 hours No results found for this basename: AMMONIA,  in the last 168 hours CBC:  Recent Labs Lab 10/12/13 1920 10/12/13 1942 10/13/13 0534  WBC  --  17.6* 16.8*  NEUTROABS  --  10.7*  --   HGB 17.0 16.0 16.0  HCT 50.0 47.1 46.8  MCV  --  91.8 91.9  PLT  --  411* 421*   Cardiac Enzymes: No results  found for this basename: CKTOTAL, CKMB, CKMBINDEX, TROPONINI,  in the last 168 hours BNP (last 3 results) No results found for this basename: PROBNP,  in the last 8760 hours CBG:  Recent Labs Lab 10/12/13 1908 10/12/13 2248 10/13/13 0654 10/13/13 1140  GLUCAP 356* 252* 200* 201*    No results found for this or any previous visit (from the past 240 hour(s)).   Studies: Dg Chest 2 View  10/13/2013   CLINICAL DATA:  Stroke, left-sided weakness, nausea for 1 day, history of hypertension, diabetes, pulmonary disease, CAD  EXAM: CHEST  2 VIEW  COMPARISON:  None.  FINDINGS: Examination is degraded due to patient body habitus. Enlarged cardiac silhouette and mediastinal contours post  median sternotomy and CABG. Evaluation of the retrosternal clear space is obscured secondary to overlying soft tissues. Mild pulmonary venous congestion without frank evidence of edema. Minimal bibasilar opacities favored to represent atelectasis. No discrete focal airspace opacities. No pleural effusion or pneumothorax. No definite evidence of edema. No acute osseus abnormalities. Post cholecystectomy. An additional surgical clip overlies expected location of the gastroesophageal junction.  IMPRESSION: Cardiomegaly, pulmonary venous congestion and bibasilar atelectasis without definite acute cardiopulmonary disease.   Electronically Signed   By: Simonne Come M.D.   On: 10/13/2013 11:36   Ct Head (brain) Wo Contrast  10/12/2013   CLINICAL DATA:  Code stroke  EXAM: CT HEAD WITHOUT CONTRAST  TECHNIQUE: Contiguous axial images were obtained from the base of the skull through the vertex without intravenous contrast.  COMPARISON:  None.  FINDINGS: No mass effect, midline shift, or acute intracranial hemorrhage. No suspicious low-density to suggest acute stroke. No evidence of hyperdense MCA. Cranium is intact. Mastoid air cells clear.  IMPRESSION: Negative head CT.   Electronically Signed   By: Maryclare Bean M.D.   On: 10/12/2013 19:16   Mr Maxine Glenn Head Wo Contrast  10/12/2013   CLINICAL DATA:  Left-sided weakness, stroke  EXAM: MRI HEAD WITHOUT CONTRAST  MRA HEAD WITHOUT CONTRAST  TECHNIQUE: Multiplanar, multiecho pulse sequences of the brain and surrounding structures were obtained without intravenous contrast. Angiographic images of the head were obtained using MRA technique without contrast.  COMPARISON:  Prior CT from earlier the same day  FINDINGS: MRI HEAD FINDINGS  The CSF containing spaces are within normal limits for patient age. Mild scattered and confluent T2/FLAIR hyperintensity within the periventricular and deep white matter of the cerebral hemispheres present, nonspecific, but likely related to mild chronic  microvascular ischemic disease. No mass lesion, midline shift, or extra-axial fluid collection. Ventricles are normal in size without evidence of hydrocephalus. Subcentimeter hypodensity seen within the left parietal lobe on SWI sequence is most compatible with a small chronic microhemorrhage.  6 mm linear oblique signal intensity seen within the right posterolateral aspect of the brainstem on axial DWI sequences, which may be artifactual in nature (series 5, image 9). There is question of focal hypointense signal intensity within this region on the corresponding ADC map. No other foci of abnormal restricted diffusion seen. Gray-white matter differentiation is maintained. Normal flow voids are seen within the intracranial vasculature. No intracranial hemorrhage identified.  The cervicomedullary junction is normal. Pituitary gland is within normal limits. Pituitary stalk is midline. The globes and optic nerves demonstrate a normal appearance with normal signal intensity. Sequelae of prior lens implant noted at the right globe.  The bone marrow signal intensity is normal. Calvarium is intact. Visualized upper cervical spine is within normal limits.  Scalp soft tissues are unremarkable.  Mild polypoid T2 hyperintensity seen within the partially visualized left maxillary sinus. The paranasal sinuses are otherwise clear. No mastoid effusion. No mastoid effusion.  MRA HEAD FINDINGS  Anterior circulation: Normal flow related enhancement of the included cervical, petrous, cavernous and supra clinoid internal carotid arteries. Patent anterior communicating artery. Multi focal irregularity seen within the M1 segments bilaterally, left greater than right without associated high-grade flow-limiting stenosis, compatible with underlying atherosclerotic disease. Focal high-grade stenosis of at least 50% is seen within the distal left M2 segment, 4 mm in length (series 203, image 10).  Posterior circulation: Right Vertebral artery  is dominant. The distal left vertebral artery is markedly diminutive. Basilar artery is patent, with normal flow related enhancement of the main branch vessels. Multi focal irregularity is seen within the P2 segments bilaterally, right greater than left, without definite high-grade flow-limiting stenosis. Fetal origin of the right posterior cerebral artery noted.  No large vessel occlusion, hemodynamically significant stenosis, abnormal luminal irregularity, aneurysm within the anterior nor posterior circulation.  IMPRESSION: MRI BRAIN:  1. 6 mm linear focus of mildly increased signal intensity on DWI sequence involving the right posterior aspect of the brainstem as above. While this finding may be artifactual in nature, possible acute/subacute ischemia involving this region is not entirely excluded. Correlation with symptoms recommended. 2. No other acute intracranial infarct or other abnormality identified. 3. Mild age-related atrophy with chronic microvascular ischemic disease involving the supratentorial white matter.  MRA BRAIN:  1. No high-grade proximal branch occlusion identified within the intracranial circulation. 2. Multi focal atherosclerotic irregularity involving the M1 segments bilaterally, left greater than right, without high-grade flow-limiting stenosis. 3. Focal short segment 4 mm stenosis of at least 50% involving the left M2 segment as above. The left MCA branches are opacified distally. 4. Multi focal atherosclerotic irregularity involving the P2 segments bilaterally, right greater than left, without definite high-grade flow-limiting stenosis. 5. Fetal origin of the right posterior cerebral artery.   Electronically Signed   By: Rise Mu M.D.   On: 10/12/2013 23:15   Mr Brain Wo Contrast  10/12/2013   CLINICAL DATA:  Left-sided weakness, stroke  EXAM: MRI HEAD WITHOUT CONTRAST  MRA HEAD WITHOUT CONTRAST  TECHNIQUE: Multiplanar, multiecho pulse sequences of the brain and  surrounding structures were obtained without intravenous contrast. Angiographic images of the head were obtained using MRA technique without contrast.  COMPARISON:  Prior CT from earlier the same day  FINDINGS: MRI HEAD FINDINGS  The CSF containing spaces are within normal limits for patient age. Mild scattered and confluent T2/FLAIR hyperintensity within the periventricular and deep white matter of the cerebral hemispheres present, nonspecific, but likely related to mild chronic microvascular ischemic disease. No mass lesion, midline shift, or extra-axial fluid collection. Ventricles are normal in size without evidence of hydrocephalus. Subcentimeter hypodensity seen within the left parietal lobe on SWI sequence is most compatible with a small chronic microhemorrhage.  6 mm linear oblique signal intensity seen within the right posterolateral aspect of the brainstem on axial DWI sequences, which may be artifactual in nature (series 5, image 9). There is question of focal hypointense signal intensity within this region on the corresponding ADC map. No other foci of abnormal restricted diffusion seen. Gray-white matter differentiation is maintained. Normal flow voids are seen within the intracranial vasculature. No intracranial hemorrhage identified.  The cervicomedullary junction is normal. Pituitary gland is within normal limits. Pituitary stalk is midline. The globes and optic nerves demonstrate a normal appearance with normal signal intensity.  Sequelae of prior lens implant noted at the right globe.  The bone marrow signal intensity is normal. Calvarium is intact. Visualized upper cervical spine is within normal limits.  Scalp soft tissues are unremarkable.  Mild polypoid T2 hyperintensity seen within the partially visualized left maxillary sinus. The paranasal sinuses are otherwise clear. No mastoid effusion. No mastoid effusion.  MRA HEAD FINDINGS  Anterior circulation: Normal flow related enhancement of the  included cervical, petrous, cavernous and supra clinoid internal carotid arteries. Patent anterior communicating artery. Multi focal irregularity seen within the M1 segments bilaterally, left greater than right without associated high-grade flow-limiting stenosis, compatible with underlying atherosclerotic disease. Focal high-grade stenosis of at least 50% is seen within the distal left M2 segment, 4 mm in length (series 203, image 10).  Posterior circulation: Right Vertebral artery is dominant. The distal left vertebral artery is markedly diminutive. Basilar artery is patent, with normal flow related enhancement of the main branch vessels. Multi focal irregularity is seen within the P2 segments bilaterally, right greater than left, without definite high-grade flow-limiting stenosis. Fetal origin of the right posterior cerebral artery noted.  No large vessel occlusion, hemodynamically significant stenosis, abnormal luminal irregularity, aneurysm within the anterior nor posterior circulation.  IMPRESSION: MRI BRAIN:  1. 6 mm linear focus of mildly increased signal intensity on DWI sequence involving the right posterior aspect of the brainstem as above. While this finding may be artifactual in nature, possible acute/subacute ischemia involving this region is not entirely excluded. Correlation with symptoms recommended. 2. No other acute intracranial infarct or other abnormality identified. 3. Mild age-related atrophy with chronic microvascular ischemic disease involving the supratentorial white matter.  MRA BRAIN:  1. No high-grade proximal branch occlusion identified within the intracranial circulation. 2. Multi focal atherosclerotic irregularity involving the M1 segments bilaterally, left greater than right, without high-grade flow-limiting stenosis. 3. Focal short segment 4 mm stenosis of at least 50% involving the left M2 segment as above. The left MCA branches are opacified distally. 4. Multi focal  atherosclerotic irregularity involving the P2 segments bilaterally, right greater than left, without definite high-grade flow-limiting stenosis. 5. Fetal origin of the right posterior cerebral artery.   Electronically Signed   By: Rise Mu M.D.   On: 10/12/2013 23:15    Scheduled Meds: . amLODipine  5 mg Oral Daily  . benazepril  40 mg Oral Daily  . Canagliflozin  300 mg Oral Daily  . clopidogrel  75 mg Oral Q breakfast  . enoxaparin (LOVENOX) injection  40 mg Subcutaneous Daily  . famotidine  40 mg Oral Q supper  . gabapentin  300 mg Oral QHS  . insulin aspart  0-9 Units Subcutaneous TID WC  . insulin glargine  30 Units Subcutaneous QHS  . isosorbide mononitrate  60 mg Oral Daily  . ranolazine  500 mg Oral BID   Continuous Infusions:   Active Problems:   Stroke    Time spent: 25 min    Meredeth Ide  [facility] Pager [phone]*. If 7PM-7AM, please contact night-coverage at [email or web address], password [facility] 10/13/2013, 4:10 PM  LOS: 1 day

## 2013-10-13 NOTE — Progress Notes (Signed)
  Echocardiogram 2D Echocardiogram with Definity has been performed.  Brandon Velez 10/13/2013, 11:05 AM

## 2013-10-13 NOTE — OR Nursing (Signed)
Definity study done with 2 cc of definity through a left  IV.  Voiced no complaints of back pain.  Received 75 cc of NS and IV saline locked after echo

## 2013-10-13 NOTE — Progress Notes (Signed)
VASCULAR LAB PRELIMINARY  PRELIMINARY  PRELIMINARY  PRELIMINARY  Carotid duplex  completed.    Preliminary report:  Bilateral:  1-39% ICA stenosis.  Vertebral artery flow is antegrade.      Gara KronerHelene F Pinky Ravan, RVT 10/13/2013, 10:42 AM

## 2013-10-13 NOTE — Evaluation (Signed)
Clinical/Bedside Swallow Evaluation Patient Details  Name: Brandon Velez MRN: [id number] Date of Birth: Feb 09, 1951  Today's Date: 10/13/2013 Time: 4696-2952 SLP Time Calculation (min): 32 min  Past Medical History:  Past Medical History  Diagnosis Date  . CAD (coronary artery disease)     s/p CABG 2004  . HTN (hypertension)   . Hyperlipidemia   . H/O splenectomy   . Diabetes mellitus    Past Surgical History: History reviewed. No pertinent past surgical history. HPI:  63 yo male adm to [facility] with gait imbalance and N/V, and dysphagia.  PMH + for CAD s/p CABG, HTN, DM, HLD.  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  Imaging study indicates subacute acute ischemia in right posterior brain stem not excluded. BSE and SLE ordered.  Family present and report pt is near baseline currently.      Assessment / Plan / Recommendation Clinical Impression  Pt with possible minimal oral deficits in swallow vs hesitation due to previous date's dysphagia symptoms.  No s/s of aspiration with timely swallow and adequate laryngeal elevation palpated at bedside.  Pt did report sensation of stasis in pharynx with applesauce/graham cracker (pt states he could eat both) without airway compromise with report of effective clearance with liquid and dry swallows.  Pt did state his throat feels "tight" - has h/o reflux for which he takes Prevacid- ? possible referrant sensation to pharynx.  Also question if vomiting has agitated nerves resulting in hypersensitivity.  Educated pt and family to aspiration precautions, mitigation strategies to ameliorate globus sensation.    Recommend regular/thin diet to allow pt to choose foods he can tolerate best.  SLP to follow up x1 due to symptoms of possible multifactorial dysphagia.      Aspiration Risk  Mild    Diet Recommendation Regular;Thin liquid   Liquid Administration via: Cup;Straw Medication Administration: Crushed with  puree Supervision: Patient able to self feed Compensations: Small sips/bites;Slow rate;Multiple dry swallows after each bite/sip Postural Changes and/or Swallow Maneuvers: Seated upright 90 degrees;Upright 30-60 min after meal    Other  Recommendations Oral Care Recommendations: Oral care BID   Follow Up Recommendations  None    Frequency and Duration min 1 x/week  1 week   Pertinent Vitals/Pain Afebrile, decreased     Swallow Study Prior Functional Status  Cognitive/Linguistic Baseline: Within functional limits (pt reports some baseline memory deficits) Type of Home: House  Lives With: Spouse (spouse works outside of the home) Available Help at Discharge: Family;Available 24 hours/day (wife 24/7 through the weekend) Education: HS graduate Vocation:  (works as Education officer, environmental, manages home duties due to wife working outside of home)    General Date of Onset: 10/13/13 HPI: 63 yo male adm to [facility] with gait imbalance and N/V, and dysphagia.  PMH + for CAD s/p CABG, HTN, DM, HLD.  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  Imaging study indicates subacute acute ischemia in right posterior brain stem not excluded. BSE and SLE ordered.  Family present and report pt is near baseline currently.    Type of Study: Bedside swallow evaluation Diet Prior to this Study: NPO Temperature Spikes Noted: No Respiratory Status: Room air History of Recent Intubation: No Behavior/Cognition: Alert Oral Cavity - Dentition: Adequate natural dentition Self-Feeding Abilities: Able to feed self Patient Positioning: Upright in bed Baseline Vocal Quality: Clear Volitional Cough: Strong Volitional Swallow: Able to elicit    Oral/Motor/Sensory Function Overall Oral  Motor/Sensory Function: Appears within functional limits for tasks assessed   Ice Chips Ice chips: Not tested   Thin Liquid Thin Liquid: Impaired Presentation: Cup Oral Phase Impairments: Reduced lingual  movement/coordination;Impaired anterior to posterior transit    Nectar Thick Nectar Thick Liquid: Not tested   Honey Thick Honey Thick Liquid: Not tested   Puree Puree: Impaired Presentation: Self Fed Other Comments: globus/stasis sensation cleared with dry and liquid swallow   Solid   GO    Solid: Impaired Presentation: Self Fed Other Comments: globus/stasis sensation cleard with dry and liquid swallow       Mills Koller, MS [facility] SLP [phone]   Of note, SLP received text page from PT as pt reported difficulties swallowing solids during lunch.  SLP phoned room from [facility] and spoke to Mrs Osler who advised pt was not coughing with intake but reported difficulty getting down green bean. Liquids tolerated well per report from spouse.  Advised pt's spouse to have pt consume primarily liquids or applesauce consistency items tonight and this SLP will follow up in am for indication for further testing.  Spouse reported understanding and agreed to information provided.    SLP phoned RN Selena Batten at 1500 to share recommendations and to assure pt gets medicines crushed with applesauce currently.  Thanks.   Donavan Burnet, MS [facility] SLP [phone]

## 2013-10-13 NOTE — Progress Notes (Signed)
PT Cancellation Note  Patient Details Name: Brandon LatusStephen Velez MRN: [id number] DOB: 01/03/1951   Cancelled Treatment:    Reason Eval/Treat Not Completed: Patient at procedure or test/unavailable   Lurena Joinerebecca B. Jennell Janosik, PT, DPT [phone]   10/13/2013, 9:50 AM

## 2013-10-13 NOTE — Progress Notes (Signed)
NEURO HOSPITALIST PROGRESS NOTE   SUBJECTIVE:                                                                                                                        He said that his swallowing and speech are much improved. MRI brain revealed a small area of probable subacute ischemia involving the right posterior aspect of the brainstem. MRA brain unremarkable. CUS and TTE are unrevealing. Cholesterol 199, Triglycerides 254, HDL 29, LDL 119. Resumed plavix. Speech pathology input greatly appreciated.   OBJECTIVE:                                                                                                                           Vital signs in last 24 hours: Temp:  [97.5 F (36.4 C)-98.2 F (36.8 C)] 98.1 F (36.7 C) (04/23 1815) Pulse Rate:  [55-104] 80 (04/23 1815) Resp:  [17-20] 20 (04/23 1815) BP: (145-171)/(63-84) 170/63 mmHg (04/23 1815) SpO2:  [93 %-99 %] 95 % (04/23 1815) Weight:  [90.992 kg (200 lb 9.6 oz)-94.348 kg (208 lb)] 94.348 kg (208 lb) (04/22 2317)  Intake/Output from previous day:   Intake/Output this shift: Total I/O In: 75 [I.V.:75] Out: 3 [Urine:3] Nutritional status: Carb Control  Past Medical History  Diagnosis Date  . CAD (coronary artery disease)     s/p CABG 2004  . HTN (hypertension)   . Hyperlipidemia   . H/O splenectomy   . Diabetes mellitus     Neurologic Exam:  Mental Status:  Alert, oriented, thought content appropriate. Speech fluent without evidence of aphasia. Mild dysarthria noted. Able to follow 3 step commands without difficulty.  Cranial Nerves:  II: Discs flat bilaterally; Visual fields grossly normal, pupils equal, round, reactive to light and accommodation  III,IV, VI: ptosis not present, extra-ocular motions intact bilaterally  V,VII: smile symmetric, facial light touch sensation normal bilaterally  VIII: hearing normal bilaterally  IX,X: gag reflex present bilaterally  XI:  bilateral shoulder shrug  XII: midline tongue extension  Motor:  Can not appreciate focal weakness. Sensory: Pinprick and light touch intact throughout, bilaterally  Deep Tendon Reflexes: 2+ throughout with absent AJ's bilaterally  Plantars:  Right: mute Left: mute  Cerebellar:  normal finger-to-nose and normal heel-to-shin  test  Gait: no tested   Lab Results: Lab Results  Component Value Date/Time   CHOL 199 10/13/2013  5:34 AM   Lipid Panel  Recent Labs  10/13/13 0534  CHOL 199  TRIG 254*  HDL 29*  CHOLHDL 6.9  VLDL 51*  LDLCALC 119*    Studies/Results: Dg Chest 2 View  10/13/2013   CLINICAL DATA:  Stroke, left-sided weakness, nausea for 1 day, history of hypertension, diabetes, pulmonary disease, CAD  EXAM: CHEST  2 VIEW  COMPARISON:  None.  FINDINGS: Examination is degraded due to patient body habitus. Enlarged cardiac silhouette and mediastinal contours post median sternotomy and CABG. Evaluation of the retrosternal clear space is obscured secondary to overlying soft tissues. Mild pulmonary venous congestion without frank evidence of edema. Minimal bibasilar opacities favored to represent atelectasis. No discrete focal airspace opacities. No pleural effusion or pneumothorax. No definite evidence of edema. No acute osseus abnormalities. Post cholecystectomy. An additional surgical clip overlies expected location of the gastroesophageal junction.  IMPRESSION: Cardiomegaly, pulmonary venous congestion and bibasilar atelectasis without definite acute cardiopulmonary disease.   Electronically Signed   By: Simonne Come M.D.   On: 10/13/2013 11:36   Ct Head (brain) Wo Contrast  10/12/2013   CLINICAL DATA:  Code stroke  EXAM: CT HEAD WITHOUT CONTRAST  TECHNIQUE: Contiguous axial images were obtained from the base of the skull through the vertex without intravenous contrast.  COMPARISON:  None.  FINDINGS: No mass effect, midline shift, or acute intracranial hemorrhage. No suspicious  low-density to suggest acute stroke. No evidence of hyperdense MCA. Cranium is intact. Mastoid air cells clear.  IMPRESSION: Negative head CT.   Electronically Signed   By: Maryclare Bean M.D.   On: 10/12/2013 19:16   Mr Maxine Glenn Head Wo Contrast  10/12/2013   CLINICAL DATA:  Left-sided weakness, stroke  EXAM: MRI HEAD WITHOUT CONTRAST  MRA HEAD WITHOUT CONTRAST  TECHNIQUE: Multiplanar, multiecho pulse sequences of the brain and surrounding structures were obtained without intravenous contrast. Angiographic images of the head were obtained using MRA technique without contrast.  COMPARISON:  Prior CT from earlier the same day  FINDINGS: MRI HEAD FINDINGS  The CSF containing spaces are within normal limits for patient age. Mild scattered and confluent T2/FLAIR hyperintensity within the periventricular and deep white matter of the cerebral hemispheres present, nonspecific, but likely related to mild chronic microvascular ischemic disease. No mass lesion, midline shift, or extra-axial fluid collection. Ventricles are normal in size without evidence of hydrocephalus. Subcentimeter hypodensity seen within the left parietal lobe on SWI sequence is most compatible with a small chronic microhemorrhage.  6 mm linear oblique signal intensity seen within the right posterolateral aspect of the brainstem on axial DWI sequences, which may be artifactual in nature (series 5, image 9). There is question of focal hypointense signal intensity within this region on the corresponding ADC map. No other foci of abnormal restricted diffusion seen. Gray-white matter differentiation is maintained. Normal flow voids are seen within the intracranial vasculature. No intracranial hemorrhage identified.  The cervicomedullary junction is normal. Pituitary gland is within normal limits. Pituitary stalk is midline. The globes and optic nerves demonstrate a normal appearance with normal signal intensity. Sequelae of prior lens implant noted at the right  globe.  The bone marrow signal intensity is normal. Calvarium is intact. Visualized upper cervical spine is within normal limits.  Scalp soft tissues are unremarkable.  Mild polypoid T2 hyperintensity seen within the partially visualized left maxillary sinus. The paranasal  sinuses are otherwise clear. No mastoid effusion. No mastoid effusion.  MRA HEAD FINDINGS  Anterior circulation: Normal flow related enhancement of the included cervical, petrous, cavernous and supra clinoid internal carotid arteries. Patent anterior communicating artery. Multi focal irregularity seen within the M1 segments bilaterally, left greater than right without associated high-grade flow-limiting stenosis, compatible with underlying atherosclerotic disease. Focal high-grade stenosis of at least 50% is seen within the distal left M2 segment, 4 mm in length (series 203, image 10).  Posterior circulation: Right Vertebral artery is dominant. The distal left vertebral artery is markedly diminutive. Basilar artery is patent, with normal flow related enhancement of the main branch vessels. Multi focal irregularity is seen within the P2 segments bilaterally, right greater than left, without definite high-grade flow-limiting stenosis. Fetal origin of the right posterior cerebral artery noted.  No large vessel occlusion, hemodynamically significant stenosis, abnormal luminal irregularity, aneurysm within the anterior nor posterior circulation.  IMPRESSION: MRI BRAIN:  1. 6 mm linear focus of mildly increased signal intensity on DWI sequence involving the right posterior aspect of the brainstem as above. While this finding may be artifactual in nature, possible acute/subacute ischemia involving this region is not entirely excluded. Correlation with symptoms recommended. 2. No other acute intracranial infarct or other abnormality identified. 3. Mild age-related atrophy with chronic microvascular ischemic disease involving the supratentorial white  matter.  MRA BRAIN:  1. No high-grade proximal branch occlusion identified within the intracranial circulation. 2. Multi focal atherosclerotic irregularity involving the M1 segments bilaterally, left greater than right, without high-grade flow-limiting stenosis. 3. Focal short segment 4 mm stenosis of at least 50% involving the left M2 segment as above. The left MCA branches are opacified distally. 4. Multi focal atherosclerotic irregularity involving the P2 segments bilaterally, right greater than left, without definite high-grade flow-limiting stenosis. 5. Fetal origin of the right posterior cerebral artery.   Electronically Signed   By: Rise MuBenjamin  McClintock M.D.   On: 10/12/2013 23:15   Mr Brain Wo Contrast  10/12/2013   CLINICAL DATA:  Left-sided weakness, stroke  EXAM: MRI HEAD WITHOUT CONTRAST  MRA HEAD WITHOUT CONTRAST  TECHNIQUE: Multiplanar, multiecho pulse sequences of the brain and surrounding structures were obtained without intravenous contrast. Angiographic images of the head were obtained using MRA technique without contrast.  COMPARISON:  Prior CT from earlier the same day  FINDINGS: MRI HEAD FINDINGS  The CSF containing spaces are within normal limits for patient age. Mild scattered and confluent T2/FLAIR hyperintensity within the periventricular and deep white matter of the cerebral hemispheres present, nonspecific, but likely related to mild chronic microvascular ischemic disease. No mass lesion, midline shift, or extra-axial fluid collection. Ventricles are normal in size without evidence of hydrocephalus. Subcentimeter hypodensity seen within the left parietal lobe on SWI sequence is most compatible with a small chronic microhemorrhage.  6 mm linear oblique signal intensity seen within the right posterolateral aspect of the brainstem on axial DWI sequences, which may be artifactual in nature (series 5, image 9). There is question of focal hypointense signal intensity within this region on the  corresponding ADC map. No other foci of abnormal restricted diffusion seen. Gray-white matter differentiation is maintained. Normal flow voids are seen within the intracranial vasculature. No intracranial hemorrhage identified.  The cervicomedullary junction is normal. Pituitary gland is within normal limits. Pituitary stalk is midline. The globes and optic nerves demonstrate a normal appearance with normal signal intensity. Sequelae of prior lens implant noted at the right globe.  The bone marrow  signal intensity is normal. Calvarium is intact. Visualized upper cervical spine is within normal limits.  Scalp soft tissues are unremarkable.  Mild polypoid T2 hyperintensity seen within the partially visualized left maxillary sinus. The paranasal sinuses are otherwise clear. No mastoid effusion. No mastoid effusion.  MRA HEAD FINDINGS  Anterior circulation: Normal flow related enhancement of the included cervical, petrous, cavernous and supra clinoid internal carotid arteries. Patent anterior communicating artery. Multi focal irregularity seen within the M1 segments bilaterally, left greater than right without associated high-grade flow-limiting stenosis, compatible with underlying atherosclerotic disease. Focal high-grade stenosis of at least 50% is seen within the distal left M2 segment, 4 mm in length (series 203, image 10).  Posterior circulation: Right Vertebral artery is dominant. The distal left vertebral artery is markedly diminutive. Basilar artery is patent, with normal flow related enhancement of the main branch vessels. Multi focal irregularity is seen within the P2 segments bilaterally, right greater than left, without definite high-grade flow-limiting stenosis. Fetal origin of the right posterior cerebral artery noted.  No large vessel occlusion, hemodynamically significant stenosis, abnormal luminal irregularity, aneurysm within the anterior nor posterior circulation.  IMPRESSION: MRI BRAIN:  1. 6 mm  linear focus of mildly increased signal intensity on DWI sequence involving the right posterior aspect of the brainstem as above. While this finding may be artifactual in nature, possible acute/subacute ischemia involving this region is not entirely excluded. Correlation with symptoms recommended. 2. No other acute intracranial infarct or other abnormality identified. 3. Mild age-related atrophy with chronic microvascular ischemic disease involving the supratentorial white matter.  MRA BRAIN:  1. No high-grade proximal branch occlusion identified within the intracranial circulation. 2. Multi focal atherosclerotic irregularity involving the M1 segments bilaterally, left greater than right, without high-grade flow-limiting stenosis. 3. Focal short segment 4 mm stenosis of at least 50% involving the left M2 segment as above. The left MCA branches are opacified distally. 4. Multi focal atherosclerotic irregularity involving the P2 segments bilaterally, right greater than left, without definite high-grade flow-limiting stenosis. 5. Fetal origin of the right posterior cerebral artery.   Electronically Signed   By: Rise Mu M.D.   On: 10/12/2013 23:15    MEDICATIONS                                                                                                                       I have reviewed the patient's current medications.  ASSESSMENT/PLAN:  63 y/o male with multiple risk factors for stroke, s/p cardiac cath 2 weeks ago (he was off plavix for 2 weeks for the procedure), who yesterday afternoon developed sudden onset of vertigo, dysarthria, dysphagia, and left sided weakness with MRI brain revealing an area of probable subacute ischemia involving the right posterior aspect of the brainstem.  Patient had a very acute onset of symptoms and the MRI-DWI findings are in the proper location to  explain his syndrome ( although the pattern appears to be more consistent with subacute ischemia). Stroke work up is pretty much completed. He will necessitate aggressive control of stroke risk factors (said that he can not tolerate statins). Will ask Dr. Pearlean Brownie to make further recommendations in the morning.    Wyatt Portela, MD [facility] [phone]  10/13/2013, 6:38 PM

## 2013-10-13 NOTE — Progress Notes (Signed)
Pt c/o of headache at 0620,iv dilaudid 1 mg given as ordered, later felt nauseous and vomited,also c/o of being dizzy, Dr Sharl MaLama paged and notified,ordered to give iv Zofran 4 mg,same given at 0743,pt reassured,will however continue to monitor. Brandon Velez

## 2013-10-13 NOTE — Progress Notes (Signed)
CARE MANAGEMENT NOTE 10/13/2013  Patient:  Brandon Velez,Brandon Velez   Account Number:  [id number]  Date Initiated:  10/13/2013  Documentation initiated by:  Jiles CrockerHANDLER,Jackeline Gutknecht  Subjective/Objective Assessment:   ADMITTED FOR STROKE WORKUP     Action/Plan:   CM FOLLOWING FOR DCP   Anticipated DC Date:  10/17/2013   Anticipated DC Plan:  AWAITING FOR PT/OT EVAL FOR DISPOSITION NEEDS WITH ATTENDING MD APPROVAL   DC Planning Services  CM consult          Status of service:  In process, will continue to follow Medicare Important Message given?  NA - LOS <3 / Initial given by admissions (If response is "NO", the following Medicare IM given date fields will be blank) Per UR Regulation:  Reviewed for med. necessity/level of care/duration of stay  Comments:  4/23/2015Abelino Derrick- B Mel Tadros RN,BSN,MHA [phone]

## 2013-10-13 NOTE — ED Provider Notes (Signed)
ECG interpretation   Date: 10/13/2013  Rate: 100  Rhythm: normal sinus rhythm  QRS Axis: normal  Intervals: normal  ST/T Wave abnormalities: normal  Conduction Disutrbances: none  Narrative Interpretation:   Old EKG Reviewed: No significant changes noted      Lyanne CoKevin M Irineo Gaulin, MD 10/13/13 0104

## 2013-10-13 NOTE — Evaluation (Signed)
Physical Therapy Evaluation Patient Details Name: Brandon Velez MRN: [id number] DOB: 07/13/1950 Today's Date: 10/13/2013   History of Present Illness  63 y.o. male admitted to [facility] on 10/12/13 with slurred speech, difficulty swallowing and HA.  Stroke workup in progress.  CT (-) for acute events.  Pt with significant PMHx of CAD, HTN, DM, and 5 vessel CABG.    Clinical Impression  Pt is moving well, very guarded, but without assistive device and very limited external assist.  Some limited vestibular assessment completed.  Pt most symptomatic with vertical head shaking (especially downward).  No nystagmus with occulomotor testing.  Log roll test (-) bil for symptoms or nystagmus.  Per SLP report pt has long standing h/o taking Meclizine, but during our session he never reported h/o vertigo, only peripheral neuropathy, but sure enough, when you look on his medication list. Meclizine is listed.  I will have to ask more about this during his next session.  I did not do [employer] testing.   PT to follow acutely for deficits listed below.       Follow Up Recommendations Home health PT;Supervision for mobility/OOB    Equipment Recommendations  None recommended by PT (may assess a cane)    Recommendations for Other Services   None    Precautions / Restrictions Precautions Precautions: Fall Precaution Comments: due to dizziness and imbalance      Mobility  Bed Mobility Overal bed mobility: Modified Independent             General bed mobility comments: pt using railing for leverage.    Transfers Overall transfer level: Needs assistance   Transfers: Sit to/from Stand Sit to Stand: Min guard         General transfer comment: Min guard assist for safety and balance during transitions.  Pt is moving slowly to help compensate for dizziness.    Ambulation/Gait Ambulation/Gait assistance: Min guard Ambulation Distance (Feet): 80 Feet Assistive device: 1 person hand held  assist;None Gait Pattern/deviations: Step-through pattern (high guard) Gait velocity: decreased Gait velocity interpretation: Below normal speed for age/gender General Gait Details: Pt with what I would call a "high guard" gait pattern.  I.e he is walking very guarded, not moving his head or trunk.  When made to do head turns with gait, he slows his speed, but is able to do so without LOB. He was able to maintain balance with supervision EOB with eyes closed.  He was also able to weight shift and turn to look over both shoulders in standing (although with slowed speed).  He needed most assist during dynamic gait activities (head turns and turning around).        Modified Rankin (Stroke Patients Only) Modified Rankin (Stroke Patients Only) Pre-Morbid Rankin Score: No symptoms Modified Rankin: Moderately severe disability     Balance Overall balance assessment: Needs assistance Sitting-balance support: Feet supported Sitting balance-Leahy Scale: Good Sitting balance - Comments: pt able to reach > 6' outside of BOS in sitting to targets in all directions.  Verbal cues needed to get him to turn his head while reaching.     Standing balance support: No upper extremity supported Standing balance-Leahy Scale: Fair Standing balance comment: limitations ovserved.  Pt guarded, moving slowly.                              Pertinent Vitals/Pain See vitals flow sheet.     Home Living Family/patient expects to  be discharged to:: Private residence Living Arrangements: Spouse/significant other Available Help at Discharge: Family;Available 24 hours/day (wife 24/7 through the weekend) Type of Home: House Home Access: Ramped entrance     Home Layout: One level Home Equipment: None Additional Comments: pt is retired and does not work any more.  His in laws (who are 70 y.o. ) live next door.      Prior Function Level of Independence: Independent         Comments: still drives      Hand Dominance   Dominant Hand: Right    Extremity/Trunk Assessment   Upper Extremity Assessment: Overall WFL for tasks assessed (strength, coordination, and sensation testing WNL.)           Lower Extremity Assessment: Overall WFL for tasks assessed (strength, coordination, and sensation testing WNL)      Cervical / Trunk Assessment: Other exceptions  Communication   Communication: No difficulties  Cognition Arousal/Alertness: Awake/alert Behavior During Therapy: WFL for tasks assessed/performed Overall Cognitive Status: Within Functional Limits for tasks assessed                               Assessment/Plan    PT Assessment Patient needs continued PT services  PT Diagnosis Difficulty walking;Abnormality of gait;Acute pain   PT Problem List Decreased balance;Decreased activity tolerance;Decreased mobility;Decreased knowledge of use of DME;Pain  PT Treatment Interventions DME instruction;Gait training;Functional mobility training;Therapeutic activities;Balance training;Therapeutic exercise;Neuromuscular re-education;Patient/family education;Modalities   PT Goals (Current goals can be found in the Care Plan section) Acute Rehab PT Goals Patient Stated Goal: to decrease pain, get his balance back PT Goal Formulation: With patient/family Time For Goal Achievement: 10/27/13 Potential to Achieve Goals: Good    Frequency Min 4X/week    End of Session Equipment Utilized During Treatment: Gait belt Activity Tolerance: Patient limited by pain Patient left: in bed;with family/visitor present;Other (comment) (seated EOB with wife, son, brother in room) Nurse Communication: Patient requests pain meds         Time: [phone] PT Time Calculation (min): 24 min   Charges:   PT Evaluation $Initial PT Evaluation Tier I: 1 Procedure PT Treatments $Therapeutic Activity: 8-22 mins        Sears Oran B. Trevious Rampey, PT, DPT [phone]   10/13/2013, 3:01  PM

## 2013-10-13 NOTE — Evaluation (Signed)
SLP Cancellation Note  Patient Details Name: Brandon Velez MRN: [id number] DOB: 02/02/1951   Cancelled treatment:       Reason Eval/Treat Not Completed: Patient at procedure or test/unavailable  Second attempt to see pt for BSE and SLE, earlier visit pt was nauseated and vomiting.  Will reattempt as schedule allows.   Donavan Burnetamara Brayley Mackowiak, MS [facility] SLP [phone]

## 2013-10-13 NOTE — Evaluation (Signed)
Speech Language Pathology Evaluation Patient Details Name: Brandon LatusStephen Velez MRN: [id number] DOB: 08/14/1950 Today's Date: 10/13/2013 Time: [id number] SLP Time Calculation (min): 20 min  Problem List:  Patient Active Problem List   Diagnosis Date Noted  . Stroke 10/12/2013  . Chest pain 09/08/2013  . Coronary atherosclerosis of native coronary artery 09/08/2013  . S/P CABG x 5 09/08/2013   Past Medical History:  Past Medical History  Diagnosis Date  . CAD (coronary artery disease)     s/p CABG 2004  . HTN (hypertension)   . Hyperlipidemia   . H/O splenectomy   . Diabetes mellitus    Past Surgical History: History reviewed. No pertinent past surgical history. HPI:  63 yo [facility] with gait imbalance and N/V, and dysphagia.  PMH + for CAD s/p CABG, HTN, DM, HLD.  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  Imaging study indicates subacute acute ischemia in right posterior brain stem not excluded. BSE and SLE ordered.  Family present and report pt is near baseline currently.      Assessment / Plan / Recommendation Clinical Impression  Pt with functional speech, language and cognition abilities.   He was able to follow multiple step commands, read multiple paragraph level information, write paragraph without deficits.  In addition, pt recalled home medications he is taking and 3/4 words independently within 10 minues *one with cue.  Family and pt agree that his speech abilities are near baseline.  No further SLP indicated for speech/cognition.      SLP Assessment  Patient does not need any further Speech Lanaguage Pathology Services    Follow Up Recommendations  None    Frequency and Duration     n/a for speech    Pertinent Vitals/Pain Afebrile, decreased    SLP Evaluation Prior Functioning  Cognitive/Linguistic Baseline: Within functional limits (pt reports some baseline memory deficits) Type of Home: House  Lives With: Spouse (spouse  works outside of the home) Education: [employer] graduate Vocation:  (works as Education officer, environmentalpastor, manages home duties due to wife working outside of home)   Cognition  Overall Cognitive Status: Within Systems developerunctional Limits for tasks assessed Arousal/Alertness: Awake/alert Orientation Level: Oriented X4 Attention: Focused;Sustained;Selective Focused Attention: Appears intact Sustained Attention: Appears intact Selective Attention: Appears intact Memory: Appears intact (able to recall 3/4 words after 10 min I, one with cue) Awareness: Appears intact Problem Solving: Appears intact Safety/Judgment: Appears intact    Comprehension  Auditory Comprehension Overall Auditory Comprehension: Appears within functional limits for tasks assessed Yes/No Questions: Not tested Commands: Within Functional Limits Conversation: Complex Visual Recognition/Discrimination Discrimination: Within Function Limits Reading Comprehension Reading Status: Within funtional limits    Expression Expression Primary Mode of Expression: Verbal Verbal Expression Overall Verbal Expression: Appears within functional limits for tasks assessed Initiation: No impairment Level of Generative/Spontaneous Verbalization: Conversation Naming: Not tested Pragmatics: No impairment Written Expression Dominant Hand: Right Written Expression: Within Functional Limits   Oral / Motor Oral Motor/Sensory Function Overall Oral Motor/Sensory Function: Appears within functional limits for tasks assessed Motor Speech Overall Motor Speech: Appears within functional limits for tasks assessed Respiration: Within functional limits Phonation: Normal (family reports voice is "lower pitch than normal") Resonance: Within functional limits Articulation: Within functional limitis Intelligibility: Intelligible Motor Planning: Witnin functional limits Motor Speech Errors: Not applicable   GO     Brandon Burnetamara Keelie Zemanek, MS [facility] SLP [phone]

## 2013-10-14 LAB — COMPREHENSIVE METABOLIC PANEL
ALK PHOS: 57 U/L (ref 39–117)
ALT: 33 U/L (ref 0–53)
AST: 22 U/L (ref 0–37)
Albumin: 3.1 g/dL — ABNORMAL LOW (ref 3.5–5.2)
BILIRUBIN TOTAL: 0.5 mg/dL (ref 0.3–1.2)
BUN: 15 mg/dL (ref 6–23)
CO2: 23 meq/L (ref 19–32)
CREATININE: 0.99 mg/dL (ref 0.50–1.35)
Calcium: 8.9 mg/dL (ref 8.4–10.5)
Chloride: 101 mEq/L (ref 96–112)
GFR, EST NON AFRICAN AMERICAN: 87 mL/min — AB (ref >90)
Glucose, Bld: 124 mg/dL — ABNORMAL HIGH (ref 70–99)
POTASSIUM: 4.1 meq/L (ref 3.7–5.3)
Sodium: 139 mEq/L (ref 137–147)
Total Protein: 7.2 g/dL (ref 6.0–8.3)

## 2013-10-14 LAB — GLUCOSE, CAPILLARY
Glucose-Capillary: 113 mg/dL — ABNORMAL HIGH (ref 70–99)
Glucose-Capillary: 278 mg/dL — ABNORMAL HIGH (ref 70–99)

## 2013-10-14 MED ORDER — ACETAMINOPHEN 325 MG PO TABS
650.0000 mg | ORAL_TABLET | Freq: Four times a day (QID) | ORAL | Status: DC | PRN
  Administered 2013-10-14: 650 mg via ORAL

## 2013-10-14 NOTE — Progress Notes (Signed)
Physical Therapy Treatment Patient Details Name: Brandon Velez MRN: [id number] DOB: 08/10/1950 Today's Date: 10/14/2013    History of Present Illness 63 y.o. male admitted to [facility] on 10/12/13 with slurred speech, difficulty swallowing and HA.  Stroke workup in progress.  CT (-) for acute events.  Pt with significant PMHx of CAD, HTN, DM, and 5 vessel CABG.  MRI revealed "a small area of probable subacute ischemia involving the right posterior aspect of the brainstem."    PT Comments    Unable to assess mobility this AM as pt had severe and increasing in intensity HA upon sitting EOB.  HA located right side posterior wrapping around to his right cheek and jaw.  RN made aware, vitals taken and were normal on RA.  Pt positioned back in supine with cold cloth and lights dimmed.  PT will attempt to come back later to re assess gait if pain allows.    Follow Up Recommendations  Home health PT;Supervision for mobility/OOB     Equipment Recommendations  None recommended by PT    Recommendations for Other Services   NA     Precautions / Restrictions Precautions Precautions: Fall Precaution Comments: due to dizziness and imbalance Restrictions Weight Bearing Restrictions: No    Mobility  Bed Mobility Overal bed mobility: Modified Independent             General bed mobility comments: Pt able to sit on side of the bed (and eventually get back in) using the railing for support.        Balance Overall balance assessment: Needs assistance Sitting-balance support: Feet supported Sitting balance-Leahy Scale: Good Sitting balance - Comments: pt sat EOB ~ 3 mins before deciding that his HA was getting too severe to continue to attempt gait and mobility at this time.  He was able to open his eyes and fix his gaze. He did not report any light sensativity.  Vitals were taken and were normal on RA.  Pt positioned back in bed and cold cloth applied.  RN informed.                              Cognition Arousal/Alertness: Awake/alert Behavior During Therapy: WFL for tasks assessed/performed Overall Cognitive Status: Within Functional Limits for tasks assessed                             Pertinent Vitals/Pain  10/14/13 1133  Vital Signs  Pulse Rate ! 52  Pain Assessment  Pain Assessment 0-10  Pain Score 10  Pain Type Acute pain  Pain Location Head  Pain Orientation Right  Pain Radiating Towards right cheek and eye  Pain Descriptors / Indicators Headache;Sharp  Pain Frequency Intermittent  Pain Onset Sudden  Pain Intervention(s) Repositioned;Cold applied;RN made aware  Oxygen Therapy  SpO2 95 %  O2 Device None (Room air)   BP 124/61 mmHg  BP Location Right arm  BP Method Automatic  Patient Position, if appropriate Lying               PT Goals (current goals can now be found in the care plan section) Acute Rehab PT Goals Patient Stated Goal: to decrease pain, get his balance back Progress towards PT goals: Not progressing toward goals - comment (limited by HA pain today)    Frequency  Min 4X/week    PT Plan Current plan remains appropriate  End of Session Equipment Utilized During Treatment: Gait belt Activity Tolerance: Patient limited by pain Patient left: in bed;with call bell/phone within reach;with family/visitor present     Time: 4098-1191 PT Time Calculation (min): 18 min  Charges:  $Therapeutic Activity: 8-22 mins            Marzetta Lanza B. Dravon Nott, PT, DPT [phone]   10/14/2013, 11:50 AM

## 2013-10-14 NOTE — Progress Notes (Signed)
Stroke Team Progress Note  HISTORY Brandon Velez is an 63 y.o. male with a history of CAD who reports that was with his wife earlier today 10/12/2013 and was doing well. She left to go to work at [employer]. When she returned she noted slurred speech and that he was having difficulty moving the left. The patient reported difficulty swallowing as well. He reports that he was trying to swallow a drink and had difficulty getting it down. Patient was brought in for evaluation at that time. Code stroke was called in the ED. Initial NIHSS of 2. Patient was not administered TPA secondary to delay in arrival. He was admitted for further evaluation and treatment.  SUBJECTIVE His wife is at the bedside.  Overall he feels his condition is completely resolved. . No new complaints.  OBJECTIVE Most recent Vital Signs: Filed Vitals:   10/13/13 2215 10/14/13 0000 10/14/13 0212 10/14/13 0540  BP: 160/75  171/78 151/69  Pulse: 70  66 65  Temp: 97.9 F (36.6 C)  97.6 F (36.4 C) 97.9 F (36.6 C)  TempSrc: Oral  Oral Oral  Resp: 18 17 20 18   Height:      Weight:      SpO2: 94%  98% 98%   CBG (last 3)   Recent Labs  10/13/13 1625 10/13/13 2157 10/14/13 0702  GLUCAP 217* 175* 113*    IV Fluid Intake:     MEDICATIONS  . amLODipine  5 mg Oral Daily  . benazepril  40 mg Oral Daily  . Canagliflozin  300 mg Oral Daily  . clopidogrel  75 mg Oral Q breakfast  . enoxaparin (LOVENOX) injection  40 mg Subcutaneous Daily  . famotidine  40 mg Oral Q supper  . gabapentin  300 mg Oral QHS  . insulin aspart  0-9 Units Subcutaneous TID WC  . insulin glargine  30 Units Subcutaneous QHS  . isosorbide mononitrate  60 mg Oral Daily  . ranolazine  500 mg Oral BID   PRN:  HYDROmorphone (DILAUDID) injection, meclizine, nitroGLYCERIN, ondansetron  Diet:  Carb Control thin liquids Activity:  As tolerated DVT Prophylaxis:  Lovenox 40 mg sq daily   CLINICALLY SIGNIFICANT STUDIES Basic Metabolic Panel:  Recent  Labs Lab 10/13/13 0534 10/14/13 0450  NA 140 139  K 4.6 4.1  CL 104 101  CO2 22 23  GLUCOSE 195* 124*  BUN 15 15  CREATININE 1.01 0.99  CALCIUM 9.4 8.9   Liver Function Tests:  Recent Labs Lab 10/12/13 1942 10/14/13 0450  AST 24 22  ALT 23 33  ALKPHOS 61 57  BILITOT <0.2* 0.5  PROT 7.7 7.2  ALBUMIN 3.6 3.1*   CBC:  Recent Labs Lab 10/12/13 1920 10/12/13 1942 10/13/13 0534  WBC  --  17.6* 16.8*  NEUTROABS  --  10.7*  --   HGB 17.0 16.0 16.0  HCT 50.0 47.1 46.8  MCV  --  91.8 91.9  PLT  --  411* 421*   Coagulation:  Recent Labs Lab 10/12/13 1942  LABPROT 11.7  INR 0.87   Cardiac Enzymes: No results found for this basename: CKTOTAL, CKMB, CKMBINDEX, TROPONINI,  in the last 168 hours Urinalysis:  Recent Labs Lab 10/12/13 2316  COLORURINE YELLOW  LABSPEC 1.015  PHURINE 6.0  GLUCOSEU >1000*  HGBUR NEGATIVE  BILIRUBINUR NEGATIVE  KETONESUR NEGATIVE  PROTEINUR NEGATIVE  UROBILINOGEN 1.0  NITRITE NEGATIVE  LEUKOCYTESUR NEGATIVE   Lipid Panel    Component Value Date/Time   CHOL 199 10/13/2013 0534  TRIG 254* 10/13/2013 0534   HDL 29* 10/13/2013 0534   CHOLHDL 6.9 10/13/2013 0534   VLDL 51* 10/13/2013 0534   LDLCALC 119* 10/13/2013 0534   HgbA1C  Lab Results  Component Value Date   HGBA1C 8.4* 10/13/2013    Urine Drug Screen:   No results found for this basename: labopia, cocainscrnur, labbenz, amphetmu, thcu, labbarb    Alcohol Level: No results found for this basename: ETH,  in the last 168 hours   CT of the brain  10/12/2013    Negative head CT.     MRI of the brain  10/12/2013    1. 6 mm linear focus of mildly increased signal intensity on DWI sequence involving the right posterior aspect of the brainstem as above. While this finding may be artifactual in nature, possible acute/subacute ischemia involving this region is not entirely excluded. Correlation with symptoms recommended. 2. No other acute intracranial infarct or other abnormality  identified. 3. Mild age-related atrophy with chronic microvascular ischemic disease involving the supratentorial white matter.    MRA of the brain  10/12/2013    1. No high-grade proximal branch occlusion identified within the intracranial circulation. 2. Multi focal atherosclerotic irregularity involving the M1 segments bilaterally, left greater than right, without high-grade flow-limiting stenosis. 3. Focal short segment 4 mm stenosis of at least 50% involving the left M2 segment as above. The left MCA branches are opacified distally. 4. Multi focal atherosclerotic irregularity involving the P2 segments bilaterally, right greater than left, without definite high-grade flow-limiting stenosis. 5. Fetal origin of the right posterior cerebral artery.     2D Echocardiogram  EF 55-60% with no source of embolus. Extremely limited due to poor sound wave transmission; definity used; there appears to be akinesis of the basal inferior posterior wall; overall preserved LV function; if clinically indicated, TEE would have greater sensitivity for source of embolus.   Carotid Doppler  No evidence of hemodynamically significant internal carotid artery stenosis. Vertebral artery flow is antegrade.   CXR  10/13/2013    Cardiomegaly, pulmonary venous congestion and bibasilar atelectasis without definite acute cardiopulmonary disease.     EKG  normal sinus rhythm. For complete results please see formal report.   Therapy Recommendations home health PT  Physical Exam   Pleasant middle aged Caucasian male not in distress.Awake alert. Afebrile. Head is nontraumatic. Neck is supple without bruit. Hearing is normal. Cardiac exam no murmur or gallop. Lungs are clear to auscultation. Distal pulses are well felt. Neurological Exam ;  Awake  Alert oriented x 3. Normal speech and language.eye movements full without nystagmus. Mild decrease in size of right palpebral fissure and pupil compared to left ? Subtle Horner`s.Fundi were  not visualized. Vision acuity and fields appear normal. Hearing is normal. Palatal movements are normal. Face symmetric. Tongue midline. Normal strength, tone, reflexes and coordination. Normal sensation. Gait deferred. ASSESSMENT Brandon Velez is a 63 y.o. male presenting with slurred speech and difficulty swallowing. Imaging confirms a subtle tiny right brainstem infarct. 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 Now on clopidogrel 75 mg orally every day for secondary stroke prevention. Patient with resultant  No deficits.Stroke work up underway.  hypertension Hyperlipidemia, LDL 119, on no statin PTA, now on no statin due to intolerance, goal LDL < 70 for diabetics Diabetes, HgbA1c 8.4, goal < 7.0  CAD - CABG 2004, cath 2 wks ago  Hx speenectomy  Hospital  day # 2  TREATMENT/PLAN  Continue clopidogrel 75 mg orally every day for secondary stroke prevention. Maintain strict control of hypertension with blood pressure goal below 130/90, diabetes with hemoglobin A1c goal below 6.5% and lipids with LDL cholesterol goal below 100 mg/dL.Exercise and lose weight.  Followup in the future with me in  2 months in office   Delia Heady, MD    To contact Stroke Continuity provider, please refer to [email or web address]. After hours, contact General Neurology

## 2013-10-14 NOTE — Progress Notes (Signed)
Discharge instructions given. Pt verbalized understanding and all questions were answered.  

## 2013-10-14 NOTE — Discharge Summary (Addendum)
Physician Discharge Summary  Brandon Velez UJW:[id number] DOB: 1951-01-11 DOA: 10/12/2013  PCP: Malka So., MD  Admit date: 10/12/2013 Discharge date: 10/14/2013  Time spent: 50* minutes  Recommendations for Outpatient Follow-up:  1. *Follow up neurology in 2 months 2.  Discharge Diagnoses:  Active Problems:   Stroke   Discharge Condition: Stable  Diet recommendation: Low fat diet  Filed Weights   10/12/13 1909 10/12/13 2317  Weight: 90.992 kg (200 lb 9.6 oz) 94.348 kg (208 lb)    History of present illness:  62 y.o. male with a history of CAD, HTN, HLD, insulin dependent DM, presented to [facility] ED with main concern of sudden onset slurred speech that his wife noted when she returned home from work. Pt reports not noticing any speech difficulty but has some difficulty with swallowing. He denies chest pain or shortness of breath, no abdominal or urinary concerns, no fevers, chills, no similar events in the past. In ED, code stroke called, NIHSS 2, neurology consulted and [facility] asked to admit for stroke work up.    Hospital Course:   CVA- MRI brain shows 6 mm linear focus of mildly increased signal intensity on DWI  sequence involving the right posterior aspect of the brainstem as  above. While this finding may be artifactual in nature, possible  acute/subacute ischemia involving this region is not entirely excluded.  Continue with plavix  Echo, carotid duplex are negative  Neurology has seen the patient and recommend to continue with Plavix.  HTN  - continue Imdur, Lisinopril, Norvasc   DM  Hb a1c is elevated to 8.4 - Will continue to take oral hypoglycemics and lantus  HLD  - Unable to tolerate the statins, will continue diet control and weight reduction  Leukocytosis - Patient has negative CXR, normal UA, normal abdominal ultrasound. He has been afebrile. 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 If you have any questions about your discharge medications or the care you received while you were in the hospital after you are discharged, you can call the unit and asked to speak with the hospitalist on call if the hospitalist that took care of you is not available. Once you are discharged, your primary care physician will handle any further medical issues. Please note that NO REFILLS for any discharge medications will be authorized once you are discharged, as it is imperative that you return to your primary care physician (or establish a relationship with a primary care physician if you do not have one) for your aftercare needs so that they can reassess your need for medications and monitor your lab values.  Discharge Orders   Future Orders Complete By Expires   Diet - low sodium heart healthy  As directed    Increase activity slowly  As directed        Medication List         amLODipine 5 MG tablet  Commonly known as:  NORVASC  Take 5 mg by mouth daily.     benazepril 40 MG tablet  Commonly known as:  LOTENSIN  Take 40 mg by mouth daily.  cetirizine 10 MG tablet  Commonly known as:  ZYRTEC  Take 10 mg by mouth daily.     clopidogrel 75 MG tablet  Commonly known as:  PLAVIX  Take 75 mg by mouth daily with breakfast.     famotidine 40 MG tablet  Commonly known as:  PEPCID  Take 40 mg by mouth daily with supper.     fluticasone 0.05 % cream  Commonly known as:  CUTIVATE  Apply topically 2 (two) times daily.     gabapentin 300 MG capsule  Commonly known as:  NEURONTIN  Take 300 mg by mouth at bedtime.      glipiZIDE 10 MG 24 hr tablet  Commonly known as:  GLUCOTROL XL  Take 10 mg by mouth 2 (two) times daily.     HUMALOG KWIKPEN 100 UNIT/ML KiwkPen  Generic drug:  insulin lispro  Inject 0-16 Units into the skin 2 (two) times daily as needed (cbg >200).     INVOKANA 300 MG Tabs  Generic drug:  Canagliflozin  Take 300 mg by mouth daily.     isosorbide mononitrate 60 MG 24 hr tablet  Commonly known as:  IMDUR  Take 60 mg by mouth daily.     LANTUS SOLOSTAR 100 UNIT/ML Solostar Pen  Generic drug:  Insulin Glargine  Inject 30 Units into the skin at bedtime.     meclizine 25 MG tablet  Commonly known as:  ANTIVERT  Take 25 mg by mouth 3 (three) times daily as needed for dizziness.     nitroGLYCERIN 0.4 MG/SPRAY spray  Commonly known as:  NITROLINGUAL  Place 1 spray under the tongue every 5 (five) minutes x 3 doses as needed for chest pain.     RANEXA 1000 MG SR tablet  Generic drug:  ranolazine  Take 500 mg by mouth 2 (two) times daily.       Allergies  Allergen Reactions  . Eggs Or Egg-Derived Products Diarrhea and Other (See Comments)    Upset stomach  . Other Diarrhea    Upset stomach, runny nose:  Food allergy:  Carrots, bananas, apples, beet sugar, filet of sole, lima beans, rice, eggs and many other foods that he cannot remember.  . Codeine Rash  . Penicillins Rash       Follow-up Information   Follow up with Gates Rigg, MD In 2 months.   Specialties:  Neurology, Radiology   Contact information:   [address] [place] [address] [phone]        The results of significant diagnostics from this hospitalization (including imaging, microbiology, ancillary and laboratory) are listed below for reference.    Significant Diagnostic Studies: Dg Chest 2 View  10/13/2013   CLINICAL DATA:  Stroke, left-sided weakness, nausea for 1 day, history of hypertension, diabetes, pulmonary disease, CAD  EXAM: CHEST  2 VIEW  COMPARISON:  None.   FINDINGS: Examination is degraded due to patient body habitus. Enlarged cardiac silhouette and mediastinal contours post median sternotomy and CABG. Evaluation of the retrosternal clear space is obscured secondary to overlying soft tissues. Mild pulmonary venous congestion without frank evidence of edema. Minimal bibasilar opacities favored to represent atelectasis. No discrete focal airspace opacities. No pleural effusion or pneumothorax. No definite evidence of edema. No acute osseus abnormalities. Post cholecystectomy. An additional surgical clip overlies expected location of the gastroesophageal junction.  IMPRESSION: Cardiomegaly, pulmonary venous congestion and bibasilar atelectasis without definite acute cardiopulmonary disease.   Electronically Signed   By: Jonny Ruiz  Watts M.D.   On: 10/13/2013 11:36   Ct Head (brain) Wo Contrast  10/12/2013   CLINICAL DATA:  Code stroke  EXAM: CT HEAD WITHOUT CONTRAST  TECHNIQUE: Contiguous axial images were obtained from the base of the skull through the vertex without intravenous contrast.  COMPARISON:  None.  FINDINGS: No mass effect, midline shift, or acute intracranial hemorrhage. No suspicious low-density to suggest acute stroke. No evidence of hyperdense MCA. Cranium is intact. Mastoid air cells clear.  IMPRESSION: Negative head CT.   Electronically Signed   By: Maryclare Bean M.D.   On: 10/12/2013 19:16   Mr Maxine Glenn Head Wo Contrast  10/12/2013   CLINICAL DATA:  Left-sided weakness, stroke  EXAM: MRI HEAD WITHOUT CONTRAST  MRA HEAD WITHOUT CONTRAST  TECHNIQUE: Multiplanar, multiecho pulse sequences of the brain and surrounding structures were obtained without intravenous contrast. Angiographic images of the head were obtained using MRA technique without contrast.  COMPARISON:  Prior CT from earlier the same day  FINDINGS: MRI HEAD FINDINGS  The CSF containing spaces are within normal limits for patient age. Mild scattered and confluent T2/FLAIR hyperintensity within the  periventricular and deep white matter of the cerebral hemispheres present, nonspecific, but likely related to mild chronic microvascular ischemic disease. No mass lesion, midline shift, or extra-axial fluid collection. Ventricles are normal in size without evidence of hydrocephalus. Subcentimeter hypodensity seen within the left parietal lobe on SWI sequence is most compatible with a small chronic microhemorrhage.  6 mm linear oblique signal intensity seen within the right posterolateral aspect of the brainstem on axial DWI sequences, which may be artifactual in nature (series 5, image 9). There is question of focal hypointense signal intensity within this region on the corresponding ADC map. No other foci of abnormal restricted diffusion seen. Gray-white matter differentiation is maintained. Normal flow voids are seen within the intracranial vasculature. No intracranial hemorrhage identified.  The cervicomedullary junction is normal. Pituitary gland is within normal limits. Pituitary stalk is midline. The globes and optic nerves demonstrate a normal appearance with normal signal intensity. Sequelae of prior lens implant noted at the right globe.  The bone marrow signal intensity is normal. Calvarium is intact. Visualized upper cervical spine is within normal limits.  Scalp soft tissues are unremarkable.  Mild polypoid T2 hyperintensity seen within the partially visualized left maxillary sinus. The paranasal sinuses are otherwise clear. No mastoid effusion. No mastoid effusion.  MRA HEAD FINDINGS  Anterior circulation: Normal flow related enhancement of the included cervical, petrous, cavernous and supra clinoid internal carotid arteries. Patent anterior communicating artery. Multi focal irregularity seen within the M1 segments bilaterally, left greater than right without associated high-grade flow-limiting stenosis, compatible with underlying atherosclerotic disease. Focal high-grade stenosis of at least 50% is  seen within the distal left M2 segment, 4 mm in length (series 203, image 10).  Posterior circulation: Right Vertebral artery is dominant. The distal left vertebral artery is markedly diminutive. Basilar artery is patent, with normal flow related enhancement of the main branch vessels. Multi focal irregularity is seen within the P2 segments bilaterally, right greater than left, without definite high-grade flow-limiting stenosis. Fetal origin of the right posterior cerebral artery noted.  No large vessel occlusion, hemodynamically significant stenosis, abnormal luminal irregularity, aneurysm within the anterior nor posterior circulation.  IMPRESSION: MRI BRAIN:  1. 6 mm linear focus of mildly increased signal intensity on DWI sequence involving the right posterior aspect of the brainstem as above. While this finding may be artifactual in nature,  possible acute/subacute ischemia involving this region is not entirely excluded. Correlation with symptoms recommended. 2. No other acute intracranial infarct or other abnormality identified. 3. Mild age-related atrophy with chronic microvascular ischemic disease involving the supratentorial white matter.  MRA BRAIN:  1. No high-grade proximal branch occlusion identified within the intracranial circulation. 2. Multi focal atherosclerotic irregularity involving the M1 segments bilaterally, left greater than right, without high-grade flow-limiting stenosis. 3. Focal short segment 4 mm stenosis of at least 50% involving the left M2 segment as above. The left MCA branches are opacified distally. 4. Multi focal atherosclerotic irregularity involving the P2 segments bilaterally, right greater than left, without definite high-grade flow-limiting stenosis. 5. Fetal origin of the right posterior cerebral artery.   Electronically Signed   By: Rise Mu M.D.   On: 10/12/2013 23:15   Mr Brain Wo Contrast  10/12/2013   CLINICAL DATA:  Left-sided weakness, stroke  EXAM: MRI  HEAD WITHOUT CONTRAST  MRA HEAD WITHOUT CONTRAST  TECHNIQUE: Multiplanar, multiecho pulse sequences of the brain and surrounding structures were obtained without intravenous contrast. Angiographic images of the head were obtained using MRA technique without contrast.  COMPARISON:  Prior CT from earlier the same day  FINDINGS: MRI HEAD FINDINGS  The CSF containing spaces are within normal limits for patient age. Mild scattered and confluent T2/FLAIR hyperintensity within the periventricular and deep white matter of the cerebral hemispheres present, nonspecific, but likely related to mild chronic microvascular ischemic disease. No mass lesion, midline shift, or extra-axial fluid collection. Ventricles are normal in size without evidence of hydrocephalus. Subcentimeter hypodensity seen within the left parietal lobe on SWI sequence is most compatible with a small chronic microhemorrhage.  6 mm linear oblique signal intensity seen within the right posterolateral aspect of the brainstem on axial DWI sequences, which may be artifactual in nature (series 5, image 9). There is question of focal hypointense signal intensity within this region on the corresponding ADC map. No other foci of abnormal restricted diffusion seen. Gray-white matter differentiation is maintained. Normal flow voids are seen within the intracranial vasculature. No intracranial hemorrhage identified.  The cervicomedullary junction is normal. Pituitary gland is within normal limits. Pituitary stalk is midline. The globes and optic nerves demonstrate a normal appearance with normal signal intensity. Sequelae of prior lens implant noted at the right globe.  The bone marrow signal intensity is normal. Calvarium is intact. Visualized upper cervical spine is within normal limits.  Scalp soft tissues are unremarkable.  Mild polypoid T2 hyperintensity seen within the partially visualized left maxillary sinus. The paranasal sinuses are otherwise clear. No  mastoid effusion. No mastoid effusion.  MRA HEAD FINDINGS  Anterior circulation: Normal flow related enhancement of the included cervical, petrous, cavernous and supra clinoid internal carotid arteries. Patent anterior communicating artery. Multi focal irregularity seen within the M1 segments bilaterally, left greater than right without associated high-grade flow-limiting stenosis, compatible with underlying atherosclerotic disease. Focal high-grade stenosis of at least 50% is seen within the distal left M2 segment, 4 mm in length (series 203, image 10).  Posterior circulation: Right Vertebral artery is dominant. The distal left vertebral artery is markedly diminutive. Basilar artery is patent, with normal flow related enhancement of the main branch vessels. Multi focal irregularity is seen within the P2 segments bilaterally, right greater than left, without definite high-grade flow-limiting stenosis. Fetal origin of the right posterior cerebral artery noted.  No large vessel occlusion, hemodynamically significant stenosis, abnormal luminal irregularity, aneurysm within the anterior nor posterior circulation.  IMPRESSION: MRI BRAIN:  1. 6 mm linear focus of mildly increased signal intensity on DWI sequence involving the right posterior aspect of the brainstem as above. While this finding may be artifactual in nature, possible acute/subacute ischemia involving this region is not entirely excluded. Correlation with symptoms recommended. 2. No other acute intracranial infarct or other abnormality identified. 3. Mild age-related atrophy with chronic microvascular ischemic disease involving the supratentorial white matter.  MRA BRAIN:  1. No high-grade proximal branch occlusion identified within the intracranial circulation. 2. Multi focal atherosclerotic irregularity involving the M1 segments bilaterally, left greater than right, without high-grade flow-limiting stenosis. 3. Focal short segment 4 mm stenosis of at least  50% involving the left M2 segment as above. The left MCA branches are opacified distally. 4. Multi focal atherosclerotic irregularity involving the P2 segments bilaterally, right greater than left, without definite high-grade flow-limiting stenosis. 5. Fetal origin of the right posterior cerebral artery.   Electronically Signed   By: Rise Mu M.D.   On: 10/12/2013 23:15   US Abdomen Complete  10/13/2013   CLINICAL DATA:  Nausea.  Splenectomy.  Cholecystectomy.  EXAM: ULTRASOUND ABDOMEN COMPLETE  COMPARISON:  None.  FINDINGS: Gallbladder:  Absent.  Common bile duct:  Diameter: 7 mm.  Liver:  No focal lesion identified. Within normal limits in parenchymal echogenicity.  IVC:  No abnormality visualized.  Pancreas:  Obscured.  Spleen:  Not visualized.  Right Kidney:  Length: 10.9 cm in length. Echogenicity within normal limits. No mass or hydronephrosis visualized.  Left Kidney:  Length: 11.9 cm in length. Echogenicity within normal limits. No mass or hydronephrosis visualized.  Abdominal aorta:  Partially obscured.  No obvious aneurysm.  Other findings:  None.  IMPRESSION: No acute intra-abdominal pathology.  Pancreas was obscured.  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   Electronically Signed   By: Maryclare Bean M.D.   On: 10/13/2013 19:21    Microbiology: No results found for this or any previous visit (from the past 240 hour(s)).   Labs: Basic Metabolic Panel:  Recent Labs Lab 10/12/13 1920 10/12/13 1942 10/13/13 0534 10/14/13 0450  NA 138 138 140 139  K 4.0 4.6 4.6 4.1  CL 100 100 104 101  CO2  --  23 22 23   GLUCOSE 337* 336* 195* 124*  BUN 19 18 15 15   CREATININE 1.20 1.10 1.01 0.99  CALCIUM  --  9.3 9.4 8.9   Liver Function Tests:  Recent Labs Lab 10/12/13 1942 10/14/13 0450  AST 24 22  ALT 23 33  ALKPHOS 61 57  BILITOT <0.2* 0.5  PROT 7.7 7.2  ALBUMIN 3.6 3.1*   No results found for this basename: LIPASE, AMYLASE,  in the last  168 hours No results found for this basename: AMMONIA,  in the last 168 hours CBC:  Recent Labs Lab 10/12/13 1920 10/12/13 1942 10/13/13 0534  WBC  --  17.6* 16.8*  NEUTROABS  --  10.7*  --   HGB 17.0 16.0 16.0  HCT 50.0 47.1 46.8  MCV  --  91.8 91.9  PLT  --  411* 421*   Cardiac Enzymes: No results found for this basename: CKTOTAL, CKMB, CKMBINDEX, TROPONINI,  in the last 168 hours BNP: BNP (last 3 results) No results found for this basename: PROBNP,  in the last 8760 hours CBG:  Recent Labs Lab 10/13/13 1140 10/13/13 1625 10/13/13 2157 10/14/13 0702 10/14/13 1156  GLUCAP 201* 217* 175* 113* 278*  Signed:  Meredeth Ide  [facility] 10/14/2013, 1:35 PM

## 2013-10-14 NOTE — Progress Notes (Signed)
Speech Language Pathology Treatment: Dysphagia  Patient Details Name: Brandon LatusStephen Velez MRN: [id number] DOB: 12/31/1950 Today's Date: 10/14/2013 Time: 0912-0935 SLP Time Calculation (min): 23 min  Assessment / Plan / Recommendation Clinical Impression  Pt with much improved swallow ability today compared to yesterday - he subjectively states swallow yesterday was a 4/10 and today 6/10. Expect swallow ability to continue to improve.   Observed pt consuming graham cracker, ice cream, and grape juice without indication of airway compromise or sensation of stasis.  Throat clearing that pt conducts at baseline continued today but to lesser extent (x2 during snack).    Pt does state he vomited last night after dinner but has tolerated po well since then.  He denies coughing nor sensing choking during intake but has some difficulty belching - SLP questions mild CP dysfunction for which pt is managing.    Educated pt and spouse to aspiration precautions and to inform MD if dysphagia symptoms do not improve significantly within the next few weeks.  SLP to sign off as pt is cognizant of his dysphagia, tolerating po diet, and all education is completed.  Thanks for letting me assist with this pt's care.    HPI HPI: 63 [facility] imbalance and N/V, and dysphagia.  PMH + for CAD s/p CABG, HTN, DM, HLD.  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  Imaging study indicates subacute acute ischemia in right posterior brain stem not excluded. Family present and report pt is near baseline currently.      Pertinent Vitals Afebrile, decreased  SLP Plan  Discharge SLP treatment due to (comment)    Recommendations Diet recommendations: Regular;Thin liquid Liquids provided via: Cup;Straw Medication Administration: Whole meds with puree Supervision: Patient able to self feed Compensations: Small sips/bites;Slow rate;Multiple dry swallows after each bite/sip Postural  Changes and/or Swallow Maneuvers: Seated upright 90 degrees;Upright 30-60 min after meal              Oral Care Recommendations: Oral care BID Follow up Recommendations: None Plan: Discharge SLP treatment due to (comment)    GO     Mills Kolleramara Ann Katha Kuehne Teodoro Jeffreys, MS [facility] SLP [phone]

## 2013-10-15 NOTE — ED Provider Notes (Signed)
I saw and evaluated the patient, reviewed the resident's note and I agree with the findings and plan.   EKG Interpretation   Date/Time:  Wednesday October 12 2013 19:16:55 EDT Ventricular Rate:  87 PR Interval:  230 QRS Duration: 109 QT Interval:  370 QTC Calculation: 445 R Axis:   65 Text Interpretation:  Sinus rhythm Prolonged PR interval Anterior infarct,  old Abnormal T, consider ischemia, lateral leads No significant change was  found Confirmed by Meiko Ives  MD, Fontaine Kossman ([id number]) on 10/13/2013 1:03:47 AM      Admit for stroke workup. Not a candidate for TPA  Lyanne CoKevin M Danner Paulding, MD 10/15/13 [phone]

## 2013-10-19 ENCOUNTER — Telehealth: Payer: Self-pay | Admitting: Neurology

## 2013-10-19 NOTE — Telephone Encounter (Signed)
Called pt to schedule a hospital f/u and pt states he is still having trouble swallowing, can only swallow soft things. Please call pt concerning this matter. Thanks

## 2013-10-21 NOTE — Telephone Encounter (Signed)
Spoke to the patient. He had dysphagia from his recent stroke which has not improved quickly enough as per patient's expectations. I offered outpatient referral to speech therapy but patient declined and wants to wait and he will call me back in a few weeks if not better

## 2013-11-29 ENCOUNTER — Telehealth: Payer: Self-pay | Admitting: Neurology

## 2013-11-29 ENCOUNTER — Encounter: Payer: Self-pay | Admitting: Neurology

## 2013-11-29 NOTE — Telephone Encounter (Signed)
Called patient to reschedule 12/19/13 appointment per Dr. Marlis Edelson schedule, moved to Dr. Roda Shutters, printed and mailed letter with new appointment time.

## 2013-11-29 NOTE — Telephone Encounter (Signed)
Called patient to reschedule 12/19/13 appointment, patient states he had stroke on 10/12/13 and he was supposed to get scheduled for therapy but hasn't heard anything yet. Please return call to patient and advise.

## 2013-11-30 NOTE — Telephone Encounter (Signed)
Ok to drive 

## 2013-11-30 NOTE — Telephone Encounter (Signed)
Spoke with patient and explained that according to telephone note from 10/21/13,he had declined the speech therapy until later, he verbalized understanding and wanted to know about driving before he has been seen in f/u appt,

## 2013-12-01 NOTE — Telephone Encounter (Signed)
Shared Dr Marlis Edelson telephone message with patient and he verbalized understanding

## 2013-12-19 ENCOUNTER — Ambulatory Visit: Payer: Self-pay | Admitting: Neurology

## 2014-01-06 ENCOUNTER — Ambulatory Visit: Payer: Self-pay | Admitting: Neurology

## 2014-01-18 ENCOUNTER — Ambulatory Visit (INDEPENDENT_AMBULATORY_CARE_PROVIDER_SITE_OTHER): Payer: Medicare Other | Admitting: Neurology

## 2014-01-18 ENCOUNTER — Encounter: Payer: Self-pay | Admitting: Neurology

## 2014-01-18 VITALS — BP 147/81 | HR 105 | Ht 67.0 in | Wt 218.5 lb

## 2014-01-18 DIAGNOSIS — I6350 Cerebral infarction due to unspecified occlusion or stenosis of unspecified cerebral artery: Secondary | ICD-10-CM

## 2014-01-18 DIAGNOSIS — I639 Cerebral infarction, unspecified: Secondary | ICD-10-CM

## 2014-01-18 DIAGNOSIS — I10 Essential (primary) hypertension: Secondary | ICD-10-CM

## 2014-01-18 DIAGNOSIS — E119 Type 2 diabetes mellitus without complications: Secondary | ICD-10-CM

## 2014-01-18 NOTE — Patient Instructions (Addendum)
1. Continue plavix for stroke prevention 2. Continue monitor BP and glucose at hime 3. Recommend medication to cover for HLD. if LDL still high, would like to use either  fibrates or low dose pravastatin for cholesterol control. 4. Follow up with endocrinologist and PCP for stroke risk factor modification 5. Continue other home meds 6. Recommend sleep study and CPAP but you will think about later. 7. Out pt referral to speech pathologist for swallow evaluation and treatment. 8. Follow up in clinic in 2 months.

## 2014-01-18 NOTE — Progress Notes (Addendum)
STROKE NEUROLOGY FOLLOW UP NOTE  NAME: Brandon Velez DOB: 05-Jun-1951  REASON FOR VISIT: stroke follow up HISTORY FROM: pt and wife and chart  Today we had the pleasure of seeing Brandon Velez in follow-up at our Neurology Clinic. Pt was accompanied by wife.   History Summary 63 yo white male with PMH of HTN, DM, HLD, OSA not on CPAP, CAD s/p CABG presented for stroke follow up. He was admitted in 09/2013 due to acute onset dizziness, swallow difficulty, slurry speech and left sided numbness with some headache. Denies any vertigo, vision change, face involvement. Questionable weakness at that time. He also had some AMS with lethargy. He was not give tPA due to delayed arrival. His MRI showed right lateral medullary infarct. He was discharged home with plavix.  Interval History During the interval time, the patient has been doing well. His speech is recovered, but he stated that he still has some problem with swallowing. Sometimes he has choking, sometimes SOB after meals and not able to lie flat after meals. His numbness at left arm and leg remained same, feeling sunburn inside arm and leg, can not tell hot or cold. 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 His DM and HTN are getting better control as per pt. BP 135-140/80, and CBG is less than 200 for most of the time. He still snores at night but not tolerating CPAP in the past. Not on statins, due to prior side effect (feeling sickness) from lipitor, crestor, zocor and lovastatin. Have not tried pravastatin or fibrates yet. Home meds on plavix, norvasc, benazepril, lantus, glipizide, etc (see med list)    REVIEW OF SYSTEMS: Full 14 system review of systems performed and notable only for those listed below and in HPI above, all others are negative:  Constitutional: fatigue  Cardiovascular: chest pain  Ear/Nose/Throat: trouble swallowing  Skin: N/A  Eyes: N/A  Respiratory: SOB  Gastroitestinal:  N/A  Genitourinary: N/A Hematology/Lymphatic: N/A  Endocrine: N/A  Musculoskeletal: N/A  Allergy/Immunology: food allergy  Neurological: memory loss, dizziness, numbness, weakness  Psychiatric: insomnia  The following represents the patient's updated allergies and side effects list: Allergies  Allergen Reactions  . Eggs Or Egg-Derived Products Diarrhea and Other (See Comments)    Upset stomach  . Other Diarrhea    Upset stomach, runny nose:  Food allergy:  Carrots, bananas, apples, beet sugar, filet of sole, lima beans, rice, eggs and many other foods that he cannot remember.  . Codeine Rash  . Penicillins Rash    Labs since last visit of relevance include the following: Results for orders placed during the hospital encounter of 10/12/13  URINALYSIS, ROUTINE W REFLEX MICROSCOPIC      Result Value Ref Range   Color, Urine YELLOW  YELLOW   APPearance CLEAR  CLEAR   Specific Gravity, Urine 1.015  1.005 - 1.030   pH 6.0  5.0 - 8.0   Glucose, UA >1000 (*) NEGATIVE mg/dL   Hgb urine dipstick NEGATIVE  NEGATIVE   Bilirubin Urine NEGATIVE  NEGATIVE   Ketones, ur NEGATIVE  NEGATIVE mg/dL   Protein, ur NEGATIVE  NEGATIVE mg/dL   Urobilinogen, UA 1.0  0.0 - 1.0 mg/dL   Nitrite NEGATIVE  NEGATIVE   Leukocytes, UA NEGATIVE  NEGATIVE  PROTIME-INR      Result Value Ref Range   Prothrombin Time 11.7  11.6 - 15.2 seconds   INR 0.87  0.00 - 1.49  APTT  Result Value Ref Range   aPTT 27  24 - 37 seconds  CBC      Result Value Ref Range   WBC 17.6 (*) 4.0 - 10.5 K/uL   RBC 5.13  4.22 - 5.81 MIL/uL   Hemoglobin 16.0  13.0 - 17.0 g/dL   HCT 09.647.1  04.539.0 - 40.952.0 %   MCV 91.8  78.0 - 100.0 fL   MCH 31.2  26.0 - 34.0 pg   MCHC 34.0  30.0 - 36.0 g/dL   RDW 81.114.9  91.411.5 - 78.215.5 %   Platelets 411 (*) 150 - 400 K/uL  DIFFERENTIAL      Result Value Ref Range   Neutrophils Relative % 61  43 - 77 %   Neutro Abs 10.7 (*) 1.7 - 7.7 K/uL   Lymphocytes Relative 29  12 - 46 %   Lymphs Abs 5.2 (*)  0.7 - 4.0 K/uL   Monocytes Relative 7  3 - 12 %   Monocytes Absolute 1.3 (*) 0.1 - 1.0 K/uL   Eosinophils Relative 2  0 - 5 %   Eosinophils Absolute 0.4  0.0 - 0.7 K/uL   Basophils Relative 1  0 - 1 %   Basophils Absolute 0.1  0.0 - 0.1 K/uL  COMPREHENSIVE METABOLIC PANEL      Result Value Ref Range   Sodium 138  137 - 147 mEq/L   Potassium 4.6  3.7 - 5.3 mEq/L   Chloride 100  96 - 112 mEq/L   CO2 23  19 - 32 mEq/L   Glucose, Bld 336 (*) 70 - 99 mg/dL   BUN 18  6 - 23 mg/dL   Creatinine, Ser 9.561.10  0.50 - 1.35 mg/dL   Calcium 9.3  8.4 - 21.310.5 mg/dL   Total Protein 7.7  6.0 - 8.3 g/dL   Albumin 3.6  3.5 - 5.2 g/dL   AST 24  0 - 37 U/L   ALT 23  0 - 53 U/L   Alkaline Phosphatase 61  39 - 117 U/L   Total Bilirubin <0.2 (*) 0.3 - 1.2 mg/dL   GFR calc non Af Amer 71 (*) >90 mL/min   GFR calc Af Amer 82 (*) >90 mL/min  HEMOGLOBIN A1C      Result Value Ref Range   Hemoglobin A1C 8.4 (*) <5.7 %   Mean Plasma Glucose 194 (*) <117 mg/dL  LIPID PANEL      Result Value Ref Range   Cholesterol 199  0 - 200 mg/dL   Triglycerides 086254 (*) <150 mg/dL   HDL 29 (*) >57>39 mg/dL   Total CHOL/HDL Ratio 6.9     VLDL 51 (*) 0 - 40 mg/dL   LDL Cholesterol 846119 (*) 0 - 99 mg/dL  CBC      Result Value Ref Range   WBC 16.8 (*) 4.0 - 10.5 K/uL   RBC 5.09  4.22 - 5.81 MIL/uL   Hemoglobin 16.0  13.0 - 17.0 g/dL   HCT 96.246.8  95.239.0 - 84.152.0 %   MCV 91.9  78.0 - 100.0 fL   MCH 31.4  26.0 - 34.0 pg   MCHC 34.2  30.0 - 36.0 g/dL   RDW 32.415.3  40.111.5 - 02.715.5 %   Platelets 421 (*) 150 - 400 K/uL  BASIC METABOLIC PANEL      Result Value Ref Range   Sodium 140  137 - 147 mEq/L   Potassium 4.6  3.7 - 5.3 mEq/L   Chloride  104  96 - 112 mEq/L   CO2 22  19 - 32 mEq/L   Glucose, Bld 195 (*) 70 - 99 mg/dL   BUN 15  6 - 23 mg/dL   Creatinine, Ser 4.09  0.50 - 1.35 mg/dL   Calcium 9.4  8.4 - 81.1 mg/dL   GFR calc non Af Amer 79 (*) >90 mL/min   GFR calc Af Amer >90  >90 mL/min  GLUCOSE, CAPILLARY      Result Value  Ref Range   Glucose-Capillary 252 (*) 70 - 99 mg/dL   Comment 1 Notify RN    URINE MICROSCOPIC-ADD ON      Result Value Ref Range   WBC, UA 0-2  <3 WBC/hpf   RBC / HPF 0-2  <3 RBC/hpf   Bacteria, UA RARE  RARE  GLUCOSE, CAPILLARY      Result Value Ref Range   Glucose-Capillary 200 (*) 70 - 99 mg/dL   Comment 1 Notify RN    GLUCOSE, CAPILLARY      Result Value Ref Range   Glucose-Capillary 201 (*) 70 - 99 mg/dL  GLUCOSE, CAPILLARY      Result Value Ref Range   Glucose-Capillary 217 (*) 70 - 99 mg/dL  COMPREHENSIVE METABOLIC PANEL      Result Value Ref Range   Sodium 139  137 - 147 mEq/L   Potassium 4.1  3.7 - 5.3 mEq/L   Chloride 101  96 - 112 mEq/L   CO2 23  19 - 32 mEq/L   Glucose, Bld 124 (*) 70 - 99 mg/dL   BUN 15  6 - 23 mg/dL   Creatinine, Ser 9.14  0.50 - 1.35 mg/dL   Calcium 8.9  8.4 - 78.2 mg/dL   Total Protein 7.2  6.0 - 8.3 g/dL   Albumin 3.1 (*) 3.5 - 5.2 g/dL   AST 22  0 - 37 U/L   ALT 33  0 - 53 U/L   Alkaline Phosphatase 57  39 - 117 U/L   Total Bilirubin 0.5  0.3 - 1.2 mg/dL   GFR calc non Af Amer 87 (*) >90 mL/min   GFR calc Af Amer >90  >90 mL/min  GLUCOSE, CAPILLARY      Result Value Ref Range   Glucose-Capillary 175 (*) 70 - 99 mg/dL   Comment 1 Notify RN     Comment 2 Documented in Chart    GLUCOSE, CAPILLARY      Result Value Ref Range   Glucose-Capillary 113 (*) 70 - 99 mg/dL   Comment 1 Notify RN     Comment 2 Documented in Chart    GLUCOSE, CAPILLARY      Result Value Ref Range   Glucose-Capillary 278 (*) 70 - 99 mg/dL  CBG MONITORING, ED      Result Value Ref Range   Glucose-Capillary 356 (*) 70 - 99 mg/dL  I-STAT CHEM 8, ED      Result Value Ref Range   Sodium 138  137 - 147 mEq/L   Potassium 4.0  3.7 - 5.3 mEq/L   Chloride 100  96 - 112 mEq/L   BUN 19  6 - 23 mg/dL   Creatinine, Ser 9.56  0.50 - 1.35 mg/dL   Glucose, Bld 213 (*) 70 - 99 mg/dL   Calcium, Ion 0.86  5.78 - 1.30 mmol/L   TCO2 24  0 - 100 mmol/L   Hemoglobin 17.0   13.0 - 17.0 g/dL   HCT 50.0  39.0 - 52.0 %  I-STAT TROPOININ, ED      Result Value Ref Range   Troponin i, poc 0.01  0.00 - 0.08 ng/mL   Comment 3             The neurologically relevant items on the patient's problem list were reviewed on today's visit.  Neurologic Examination  A problem focused neurological exam (12 or more points of the single system neurologic examination, vital signs counts as 1 point, cranial nerves count for 8 points) was performed.  Blood pressure 147/81, pulse 105, height 5\' 7"  (1.702 m), weight 218 lb 8 oz (99.111 kg).  General - Well nourished, well developed, in no apparent distress.  Ophthalmologic - Sharp disc margins OU.  Cardiovascular - Regular rate and rhythm with no murmur.  Mental Status -  Level of arousal and orientation to time, place, and person were intact. Language including expression, naming, repetition, comprehension was assessed and found intact. Attention span and concentration were normal. Recent and remote memory were assessed and delayed recall 4/5. Fund of Knowledge was assessed and was intact.  Cranial Nerves II - XII - II - Vision intact OU. III, IV, VI - Extraocular movements intact. V - Facial sensation intact bilaterally. VII - Facial movement intact bilaterally. VIII - Hearing & vestibular intact bilaterally. X - Palate elevates symmetrically. XI - Chin turning & shoulder shrug intact bilaterally. XII - Tongue protrusion intact.  Motor Strength - The patient's strength was normal in all extremities and pronator drift was absent.  Bulk was normal and fasciculations were absent.   Motor Tone - Muscle tone was assessed at the neck and appendages and was normal.  Reflexes - The patient's reflexes were normal in all extremities and he had no pathological reflexes.  Sensory - Light touch, temperature/pinprick symmetrical but vibration and proprioception decreased b/l LEs below the knee, and Romberg testing was teetering  but no fall.    Coordination - The patient had normal movements in the hands and feet with no ataxia or dysmetria.  Tremor was absent.  Gait and Station - The patient's transfers, posture, gait, station, and turns were observed as normal.  MOCA  visuospatial - executive 3/5 Naming - 3/3 Memory -  Attention - 2/2, 1/1, 3/3 Language - 2/2, 1/1 Abstraction - 2/2 Delayed recall - 4/5 Orientation - 6/6  Total - 27/30  GDS - 3  Data reviewed: I personally reviewed the images and agree with the radiology interpretations.  CT of the brain 10/12/2013 Negative head CT.  MRI of the brain 10/12/2013 1. 6 mm linear focus of mildly increased signal intensity on DWI sequence involving the right posterior aspect of the brainstem as above. While this finding may be artifactual in nature, possible acute/subacute ischemia involving this region is not entirely excluded. Correlation with symptoms recommended. 2. No other acute intracranial infarct or other abnormality identified. 3. Mild age-related atrophy with chronic microvascular ischemic disease involving the supratentorial white matter.  MRA of the brain 10/12/2013 1. No high-grade proximal branch occlusion identified within the intracranial circulation. 2. Multi focal atherosclerotic irregularity involving the M1 segments bilaterally, left greater than right, without high-grade flow-limiting stenosis. 3. Focal short segment 4 mm stenosis of at least 50% involving the left M2 segment as above. The left MCA branches are opacified distally. 4. Multi focal atherosclerotic irregularity involving the P2 segments bilaterally, right greater than left, without definite high-grade flow-limiting stenosis. 5. Fetal origin of the right posterior cerebral artery.  2D  Echocardiogram EF 55-60% with no source of embolus. Extremely limited due to poor sound wave transmission; definity used; there appears to be akinesis of the basal inferior posterior wall; overall preserved  LV function; if clinically indicated, TEE would have greater sensitivity for source of embolus.  Carotid Doppler No evidence of hemodynamically significant internal carotid artery stenosis. Vertebral artery flow is antegrade.  CXR 10/13/2013 Cardiomegaly, pulmonary venous congestion and bibasilar atelectasis without definite acute cardiopulmonary disease.  EKG normal sinus rhythm. For complete results please see formal report.   Component     Latest Ref Rng 10/13/2013  Cholesterol     0 - 200 mg/dL 960  Triglycerides     <150 mg/dL 454 (H)  HDL     >09 mg/dL 29 (L)  Total CHOL/HDL Ratio      6.9  VLDL     0 - 40 mg/dL 51 (H)  LDL (calc)     0 - 99 mg/dL 811 (H)  Hemoglobin B1Y     <5.7 % 8.4 (H)  Mean Plasma Glucose     <117 mg/dL 782 (H)    Assessment: As you may recall, he is a 63 y.o. Caucasian male with a diagnosis of stroke. He was admitted in 09/2013 due to dizziness not vertigo, swallow difficulty, slurry speech and left arm and leg numbness. 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 Etiology felt to be small vessel ischemic stroke although pt stated that he had cardiac cath one week prior to the event. He needs continue his stroke risk factor control and monitor BP and CBG at home. OSA is a independent risk factor for stroke but he is not ready to start treatment yet. His swallow is a little concerning so I will have speech pathology consult for him. He should be on statin for stroke prevention and treat for HLD but he had side effects with many statins. I recommend either low dose pravastatin or start him on fibrates. He will discuss with his PCP next week.  Plan:  Continue Plavix for stroke prevention Would like statin for stroke prevention too, but pt will discuss with his PCP next week for pravastatin or fibrates.  Speech pathology referral for swallow evaluation and treatment Continue meds for HTN and DM and  monitor glucose and BP at home Follow up with PCP next week for risk factor modification Pt not ready for OSA treatment at this time RTC in 2 months.  Diagnoses from this visit: Stroke - Plan: ULTRA-THIN II SHORT PEN NEEDLE 31G X 8 MM MISC, Ambulatory referral to Speech Therapy  Orders Placed This Encounter  Procedures  . Ambulatory referral to Speech Therapy    Referral Priority:  Routine    Referral Type:  Speech Therapy    Referral Reason:  Specialty Services Required    Requested Specialty:  Speech Pathology    Number of Visits Requested:  1   Meds ordered this encounter  Medications  . ULTRA-THIN II SHORT PEN NEEDLE 31G X 8 MM MISC    Sig:    Patient Instructions  1. Continue plavix for stroke prevention 2. Continue monitor BP and glucose at hime 3. Recommend medication to cover for HLD. if LDL still high, would like to use either  fibrates or low dose pravastatin for cholesterol control. 4. Follow up with endocrinologist and PCP for stroke risk factor modification 5. Continue other home meds 6. Recommend sleep study and CPAP but you will think  about later. 7. Out pt referral to speech pathologist for swallow evaluation and treatment. 8. Follow up in clinic in 2 months.   Marvel Plan, MD PhD [facility] [address], [address] [place], [place] [address] [phone]

## 2014-02-09 ENCOUNTER — Telehealth: Payer: Self-pay | Admitting: *Deleted

## 2014-02-09 ENCOUNTER — Other Ambulatory Visit: Payer: Self-pay | Admitting: *Deleted

## 2014-02-09 DIAGNOSIS — I6350 Cerebral infarction due to unspecified occlusion or stenosis of unspecified cerebral artery: Secondary | ICD-10-CM

## 2014-02-09 NOTE — Telephone Encounter (Signed)
CAlled pt and relayed the appt date and time as per below.  He verbalized understanding.

## 2014-02-09 NOTE — Telephone Encounter (Signed)
I called and appt made for next Thursday 02-16-14 at 1100.Marland Kitchen. Check in at 1st floor Radiology.  [phone] re: [facility]  [phone] No authorization needed for participating facility.   [facility] participates with [facility].  [facility].   No AIM  auth needed.

## 2014-02-10 ENCOUNTER — Other Ambulatory Visit (HOSPITAL_COMMUNITY): Payer: Self-pay | Admitting: Neurology

## 2014-02-10 DIAGNOSIS — R1310 Dysphagia, unspecified: Secondary | ICD-10-CM

## 2014-02-16 ENCOUNTER — Ambulatory Visit (HOSPITAL_COMMUNITY)
Admission: RE | Admit: 2014-02-16 | Discharge: 2014-02-16 | Disposition: A | Discharge status: Home / Self Care | Payer: Medicare Other | Source: Ambulatory Visit | Attending: Neurology | Admitting: Neurology

## 2014-02-16 DIAGNOSIS — E119 Type 2 diabetes mellitus without complications: Secondary | ICD-10-CM | POA: Diagnosis not present

## 2014-02-16 DIAGNOSIS — I6350 Cerebral infarction due to unspecified occlusion or stenosis of unspecified cerebral artery: Secondary | ICD-10-CM | POA: Insufficient documentation

## 2014-02-16 DIAGNOSIS — Z951 Presence of aortocoronary bypass graft: Secondary | ICD-10-CM | POA: Diagnosis not present

## 2014-02-16 DIAGNOSIS — I2510 Atherosclerotic heart disease of native coronary artery without angina pectoris: Secondary | ICD-10-CM | POA: Insufficient documentation

## 2014-02-16 DIAGNOSIS — K219 Gastro-esophageal reflux disease without esophagitis: Secondary | ICD-10-CM | POA: Diagnosis not present

## 2014-02-16 DIAGNOSIS — Z8673 Personal history of transient ischemic attack (TIA), and cerebral infarction without residual deficits: Secondary | ICD-10-CM | POA: Insufficient documentation

## 2014-02-16 DIAGNOSIS — E785 Hyperlipidemia, unspecified: Secondary | ICD-10-CM | POA: Diagnosis not present

## 2014-02-16 DIAGNOSIS — R1310 Dysphagia, unspecified: Secondary | ICD-10-CM | POA: Insufficient documentation

## 2014-02-16 DIAGNOSIS — R1313 Dysphagia, pharyngeal phase: Secondary | ICD-10-CM | POA: Diagnosis not present

## 2014-02-16 NOTE — Procedures (Signed)
Objective Swallowing Evaluation: Modified Barium Swallowing Study  Patient Details  Name: Brandon Velez MRN: [id number] Date of Birth: 1950/10/31  Today's Date: 02/16/2014 Time: 1107-1130 SLP Time Calculation (min): 23 min  Past Medical History:  Past Medical History  Diagnosis Date  . CAD (coronary artery disease)     s/p CABG 2004  . HTN (hypertension)   . Hyperlipidemia   . H/O splenectomy   . Diabetes mellitus    Past Surgical History: No past surgical history on file. HPI:  63 year old male with recent CVA 09/2013, seen for OP MBS due to ongoing complaints of difficulty swallowing characterized by globus post swallow. Per patient, onset began immediately post CVA, improved, then resumed aproximately 1 week ago. Patient with a h/o GER.      Assessment / Plan / Recommendation Clinical Impression  Dysphagia Diagnosis: Suspected primary esophageal dysphagia;Mild pharyngeal phase dysphagia Clinical impression: Patient presents with a mild dysphagia characterized by c/o globus occurring both with and without the presence of mild vallecular residuals. Given h/o CVA and GER, presence of both residuals, tight appearing UES, and c/o globus may be due to weakness from recent CVA, GER, or combination of both. Overall, patient compensating well with use of small sips, liquid washes, slow rate of intake, all of which were reinforced by SLP to utilize to minimize symptoms and decrease aspiration risk which is low at this time. Additional education complete regarding diet consistencies and recommendations for f/u OP SLP services to address possible base of tongue weakness.      Treatment Recommendation  Defer treatment plan to SLP at (Comment) (OP)    Diet Recommendation Regular;Thin liquid (moistened solids)   Liquid Administration via: Cup;Straw Medication Administration: Whole meds with liquid Supervision: Patient able to self feed Compensations: Slow rate;Small sips/bites;Follow solids with  liquid Postural Changes and/or Swallow Maneuvers: Seated upright 90 degrees;Upright 30-60 min after meal    Other  Recommendations Oral Care Recommendations: Oral care BID   Follow Up Recommendations  Outpatient SLP         General HPI: 64 year old male with recent CVA 09/2013, seen for OP MBS due to ongoing complaints of difficulty swallowing characterized by globus post swallow. Per patient, onset began immediately post CVA, improved, then resumed aproximately 1 week ago. Patient with a h/o GER.  Type of Study: Modified Barium Swallowing Study Reason for Referral: Objectively evaluate swallowing function Previous Swallow Assessment: seen by SLP 09/2013 following CVA who noted intact oropharyngeal swallowing function, suspected esophageal deficits.  Diet Prior to this Study: Regular;Thin liquids Temperature Spikes Noted: No Respiratory Status: Room air History of Recent Intubation: No Behavior/Cognition: Alert;Cooperative;Pleasant mood Oral Cavity - Dentition: Adequate natural dentition Oral Motor / Sensory Function: Impaired motor (slight left sided labial assymetry) Self-Feeding Abilities: Able to feed self Patient Positioning: Upright in chair Baseline Vocal Quality: Clear Volitional Cough: Strong Volitional Swallow: Able to elicit Anatomy: Within functional limits Pharyngeal Secretions: Not observed secondary MBS    Reason for Referral Objectively evaluate swallowing function   Oral Phase Oral Preparation/Oral Phase Oral Phase: WFL (except pill) Oral - Solids Oral - Pill: Delayed oral transit (midly delayed)   Pharyngeal Phase Pharyngeal Phase Pharyngeal Phase: Impaired Pharyngeal - Thin Pharyngeal - Thin Cup: Pharyngeal residue - valleculae Pharyngeal - Thin Straw: Pharyngeal residue - valleculae Pharyngeal - Solids Pharyngeal - Puree: Pharyngeal residue - valleculae Pharyngeal - Mechanical Soft: Pharyngeal residue - valleculae Pharyngeal - Pill: Within functional  limits  Cervical Esophageal Phase  GO    Cervical Esophageal Phase Cervical Esophageal Phase: Impaired Cervical Esophageal Phase - Thin Thin Cup: Reduced cricopharyngeal relaxation Thin Straw: Reduced cricopharyngeal relaxation Cervical Esophageal Phase - Solids Puree: Reduced cricopharyngeal relaxation Mechanical Soft: Reduced cricopharyngeal relaxation Pill: Reduced cricopharyngeal relaxation    Functional Assessment Tool Used: skilled clinical judgement Functional Limitations: Swallowing Swallow Current Status (Z6109): At least 1 percent but less than 20 percent impaired, limited or restricted Swallow Goal Status [phone]): At least 1 percent but less than 20 percent impaired, limited or restricted Swallow Discharge Status [phone]): At least 1 percent but less than 20 percent impaired, limited or restricted   [facility] MA, CCC-SLP [phone]  Brandon Velez 02/16/2014, 1:07 PM

## 2014-03-06 ENCOUNTER — Telehealth: Payer: Self-pay | Admitting: Neurology

## 2014-03-06 NOTE — Telephone Encounter (Signed)
Patient returning [place], RN's call.  Has additional questions.

## 2014-03-06 NOTE — Telephone Encounter (Signed)
Worsening L leg swelling, from stroke side.  He is active,, Bp is ok.  He is taking gabapentin  po qhs.  I encourage to continue to try to get to pcp for eval.  He has appt in 1 mo.   Will try to see if cancellations,.

## 2014-03-06 NOTE — Telephone Encounter (Signed)
Patient questioning if need to be seen earlier with Dr. Roda Shutters (10/5).  Left leg swollen and very painful, has given out on him recently.  Please call anytime and leave detailed message on voicemail.

## 2014-03-06 NOTE — Telephone Encounter (Signed)
LMVM for pt returning call about L swollen leg.  (See pcp or urgent care).

## 2014-03-07 ENCOUNTER — Ambulatory Visit: Payer: Medicare Other | Attending: Neurology | Admitting: Speech Pathology

## 2014-03-07 DIAGNOSIS — I10 Essential (primary) hypertension: Secondary | ICD-10-CM | POA: Insufficient documentation

## 2014-03-07 DIAGNOSIS — K219 Gastro-esophageal reflux disease without esophagitis: Secondary | ICD-10-CM | POA: Insufficient documentation

## 2014-03-07 DIAGNOSIS — IMO0001 Reserved for inherently not codable concepts without codable children: Secondary | ICD-10-CM | POA: Diagnosis not present

## 2014-03-07 DIAGNOSIS — E119 Type 2 diabetes mellitus without complications: Secondary | ICD-10-CM | POA: Insufficient documentation

## 2014-03-07 DIAGNOSIS — R1310 Dysphagia, unspecified: Secondary | ICD-10-CM | POA: Insufficient documentation

## 2014-03-07 DIAGNOSIS — Z951 Presence of aortocoronary bypass graft: Secondary | ICD-10-CM | POA: Insufficient documentation

## 2014-03-13 ENCOUNTER — Ambulatory Visit: Payer: Medicare Other

## 2014-03-13 DIAGNOSIS — IMO0001 Reserved for inherently not codable concepts without codable children: Secondary | ICD-10-CM | POA: Diagnosis not present

## 2014-03-14 ENCOUNTER — Ambulatory Visit: Payer: Medicare Other | Admitting: Speech Pathology

## 2014-03-14 ENCOUNTER — Other Ambulatory Visit: Payer: Self-pay | Admitting: Neurology

## 2014-03-14 DIAGNOSIS — I639 Cerebral infarction, unspecified: Secondary | ICD-10-CM

## 2014-03-14 DIAGNOSIS — IMO0001 Reserved for inherently not codable concepts without codable children: Secondary | ICD-10-CM | POA: Diagnosis not present

## 2014-03-16 ENCOUNTER — Ambulatory Visit: Payer: Medicare Other | Admitting: Speech Pathology

## 2014-03-22 ENCOUNTER — Encounter: Payer: Medicare Other | Admitting: Speech Pathology

## 2014-03-24 ENCOUNTER — Ambulatory Visit: Payer: Medicare Other | Attending: Neurology

## 2014-03-24 ENCOUNTER — Ambulatory Visit: Payer: Medicare Other | Admitting: Physical Therapy

## 2014-03-24 ENCOUNTER — Ambulatory Visit: Payer: Medicare Other | Admitting: Occupational Therapy

## 2014-03-24 DIAGNOSIS — I69991 Dysphagia following unspecified cerebrovascular disease: Secondary | ICD-10-CM | POA: Diagnosis not present

## 2014-03-24 DIAGNOSIS — I69954 Hemiplegia and hemiparesis following unspecified cerebrovascular disease affecting left non-dominant side: Secondary | ICD-10-CM | POA: Insufficient documentation

## 2014-03-24 DIAGNOSIS — M6281 Muscle weakness (generalized): Secondary | ICD-10-CM | POA: Insufficient documentation

## 2014-03-24 DIAGNOSIS — R269 Unspecified abnormalities of gait and mobility: Secondary | ICD-10-CM | POA: Insufficient documentation

## 2014-03-24 DIAGNOSIS — R1310 Dysphagia, unspecified: Secondary | ICD-10-CM | POA: Diagnosis not present

## 2014-03-27 ENCOUNTER — Ambulatory Visit (INDEPENDENT_AMBULATORY_CARE_PROVIDER_SITE_OTHER): Payer: Medicare Other | Admitting: Neurology

## 2014-03-27 ENCOUNTER — Encounter: Payer: Self-pay | Admitting: Neurology

## 2014-03-27 ENCOUNTER — Ambulatory Visit: Payer: Medicare Other

## 2014-03-27 VITALS — BP 137/78 | HR 90 | Wt 225.8 lb

## 2014-03-27 DIAGNOSIS — I10 Essential (primary) hypertension: Secondary | ICD-10-CM

## 2014-03-27 DIAGNOSIS — E785 Hyperlipidemia, unspecified: Secondary | ICD-10-CM

## 2014-03-27 DIAGNOSIS — E119 Type 2 diabetes mellitus without complications: Secondary | ICD-10-CM

## 2014-03-27 DIAGNOSIS — I69954 Hemiplegia and hemiparesis following unspecified cerebrovascular disease affecting left non-dominant side: Secondary | ICD-10-CM | POA: Diagnosis not present

## 2014-03-27 DIAGNOSIS — I639 Cerebral infarction, unspecified: Secondary | ICD-10-CM

## 2014-03-27 NOTE — Progress Notes (Signed)
STROKE NEUROLOGY FOLLOW UP NOTE  NAME: Brandon Velez DOB: 28-Apr-1951  REASON FOR VISIT: stroke follow up HISTORY FROM: chart  Today we had the pleasure of seeing Brandon Velez in follow-up at our [facility]. Pt was accompanied by no one.   History Summary 63 yo white male with PMH of HTN, DM, HLD, OSA not on CPAP, CAD s/p CABG presented for stroke follow up. He was admitted in 09/2013 due to acute onset dizziness, swallow difficulty, slurry speech and left sided numbness with some headache. Denies any vertigo, vision change, face involvement. Questionable weakness at that time. He also had some AMS with lethargy. He was not give tPA due to delayed arrival. His MRI showed right lateral medullary infarct. He was discharged home with plavix.  Follow up 01/18/14 - the patient has been doing well. His speech is recovered, but he stated that he still has some problem with swallowing. Sometimes he has choking, sometimes SOB after meals and not able to lie flat after meals. His numbness at left arm and leg remained same, feeling sunburn inside arm and leg, can not tell hot or cold. BP 135-140/80, and CBG is less than 200 for most of the time. He still snores at night but not tolerating CPAP in the past. Not on statins, due to prior side effect (feeling sickness) from lipitor, crestor, zocor and lovastatin. Have not tried pravastatin or fibrates yet.     Interval History During the interval time, the patient has been doing well. He has been working with Doctor, general practice for improving swallow function. He is on regular diet with thin liquid. Throat felt better. 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 He will see his PCP in 3 weeks to discuss about meds for high LDL. He is following up with endocrinologist for glucose control and his A1C 2 weeks ago was about 11. Not on CPAP machine and does not want it. Now sleeping with bed of head elevated at 15-20 degree, feels  better.  REVIEW OF SYSTEMS: Full 14 system review of systems performed and notable only for those listed below and in HPI above, all others are negative:  Constitutional: fatigue  Cardiovascular: chest pain, leg swelling Ear/Nose/Throat: trouble swallowing  Skin: N/A  Eyes: N/A  Respiratory: SOB  Gastroitestinal: N/A  Genitourinary: N/A Hematology/Lymphatic: N/A  Endocrine: N/A  Musculoskeletal: muscle creamp Allergy/Immunology: food allergy  Neurological: memory loss, dizziness, numbness, weakness  Psychiatric: insomnia, restless leg  The following represents the patient's updated allergies and side effects list: Allergies  Allergen Reactions  . Eggs Or Egg-Derived Products Diarrhea and Other (See Comments)    Upset stomach  . Other Diarrhea    Upset stomach, runny nose:  Food allergy:  Carrots, bananas, apples, beet sugar, filet of sole, lima beans, rice, eggs and many other foods that he cannot remember.  . Codeine Rash  . Penicillins Rash    Labs since last visit of relevance include the following: Results for orders placed during the hospital encounter of 10/12/13  URINALYSIS, ROUTINE W REFLEX MICROSCOPIC      Result Value Ref Range   Color, Urine YELLOW  YELLOW   APPearance CLEAR  CLEAR   Specific Gravity, Urine 1.015  1.005 - 1.030   pH 6.0  5.0 - 8.0   Glucose, UA >1000 (*) NEGATIVE mg/dL   Hgb urine dipstick NEGATIVE  NEGATIVE   Bilirubin Urine NEGATIVE  NEGATIVE   Ketones, ur NEGATIVE  NEGATIVE mg/dL  Protein, ur NEGATIVE  NEGATIVE mg/dL   Urobilinogen, UA 1.0  0.0 - 1.0 mg/dL   Nitrite NEGATIVE  NEGATIVE   Leukocytes, UA NEGATIVE  NEGATIVE  PROTIME-INR      Result Value Ref Range   Prothrombin Time 11.7  11.6 - 15.2 seconds   INR 0.87  0.00 - 1.49  APTT      Result Value Ref Range   aPTT 27  24 - 37 seconds  CBC      Result Value Ref Range   WBC 17.6 (*) 4.0 - 10.5 K/uL   RBC 5.13  4.22 - 5.81 MIL/uL   Hemoglobin 16.0  13.0 - 17.0 g/dL   HCT 16.1   09.6 - 04.5 %   MCV 91.8  78.0 - 100.0 fL   MCH 31.2  26.0 - 34.0 pg   MCHC 34.0  30.0 - 36.0 g/dL   RDW 40.9  81.1 - 91.4 %   Platelets 411 (*) 150 - 400 K/uL  DIFFERENTIAL      Result Value Ref Range   Neutrophils Relative % 61  43 - 77 %   Neutro Abs 10.7 (*) 1.7 - 7.7 K/uL   Lymphocytes Relative 29  12 - 46 %   Lymphs Abs 5.2 (*) 0.7 - 4.0 K/uL   Monocytes Relative 7  3 - 12 %   Monocytes Absolute 1.3 (*) 0.1 - 1.0 K/uL   Eosinophils Relative 2  0 - 5 %   Eosinophils Absolute 0.4  0.0 - 0.7 K/uL   Basophils Relative 1  0 - 1 %   Basophils Absolute 0.1  0.0 - 0.1 K/uL  COMPREHENSIVE METABOLIC PANEL      Result Value Ref Range   Sodium 138  137 - 147 mEq/L   Potassium 4.6  3.7 - 5.3 mEq/L   Chloride 100  96 - 112 mEq/L   CO2 23  19 - 32 mEq/L   Glucose, Bld 336 (*) 70 - 99 mg/dL   BUN 18  6 - 23 mg/dL   Creatinine, Ser 7.82  0.50 - 1.35 mg/dL   Calcium 9.3  8.4 - 95.6 mg/dL   Total Protein 7.7  6.0 - 8.3 g/dL   Albumin 3.6  3.5 - 5.2 g/dL   AST 24  0 - 37 U/L   ALT 23  0 - 53 U/L   Alkaline Phosphatase 61  39 - 117 U/L   Total Bilirubin <0.2 (*) 0.3 - 1.2 mg/dL   GFR calc non Af Amer 71 (*) >90 mL/min   GFR calc Af Amer 82 (*) >90 mL/min  HEMOGLOBIN A1C      Result Value Ref Range   Hemoglobin A1C 8.4 (*) <5.7 %   Mean Plasma Glucose 194 (*) <117 mg/dL  LIPID PANEL      Result Value Ref Range   Cholesterol 199  0 - 200 mg/dL   Triglycerides 213 (*) <150 mg/dL   HDL 29 (*) >08 mg/dL   Total CHOL/HDL Ratio 6.9     VLDL 51 (*) 0 - 40 mg/dL   LDL Cholesterol 657 (*) 0 - 99 mg/dL  CBC      Result Value Ref Range   WBC 16.8 (*) 4.0 - 10.5 K/uL   RBC 5.09  4.22 - 5.81 MIL/uL   Hemoglobin 16.0  13.0 - 17.0 g/dL   HCT 84.6  96.2 - 95.2 %   MCV 91.9  78.0 - 100.0 fL   MCH 31.4  26.0 - 34.0 pg   MCHC 34.2  30.0 - 36.0 g/dL   RDW 16.115.3  09.611.5 - 04.515.5 %   Platelets 421 (*) 150 - 400 K/uL  BASIC METABOLIC PANEL      Result Value Ref Range   Sodium 140  137 - 147 mEq/L    Potassium 4.6  3.7 - 5.3 mEq/L   Chloride 104  96 - 112 mEq/L   CO2 22  19 - 32 mEq/L   Glucose, Bld 195 (*) 70 - 99 mg/dL   BUN 15  6 - 23 mg/dL   Creatinine, Ser 4.091.01  0.50 - 1.35 mg/dL   Calcium 9.4  8.4 - 81.110.5 mg/dL   GFR calc non Af Amer 79 (*) >90 mL/min   GFR calc Af Amer >90  >90 mL/min  GLUCOSE, CAPILLARY      Result Value Ref Range   Glucose-Capillary 252 (*) 70 - 99 mg/dL   Comment 1 Notify RN    URINE MICROSCOPIC-ADD ON      Result Value Ref Range   WBC, UA 0-2  <3 WBC/hpf   RBC / HPF 0-2  <3 RBC/hpf   Bacteria, UA RARE  RARE  GLUCOSE, CAPILLARY      Result Value Ref Range   Glucose-Capillary 200 (*) 70 - 99 mg/dL   Comment 1 Notify RN    GLUCOSE, CAPILLARY      Result Value Ref Range   Glucose-Capillary 201 (*) 70 - 99 mg/dL  GLUCOSE, CAPILLARY      Result Value Ref Range   Glucose-Capillary 217 (*) 70 - 99 mg/dL  COMPREHENSIVE METABOLIC PANEL      Result Value Ref Range   Sodium 139  137 - 147 mEq/L   Potassium 4.1  3.7 - 5.3 mEq/L   Chloride 101  96 - 112 mEq/L   CO2 23  19 - 32 mEq/L   Glucose, Bld 124 (*) 70 - 99 mg/dL   BUN 15  6 - 23 mg/dL   Creatinine, Ser 9.140.99  0.50 - 1.35 mg/dL   Calcium 8.9  8.4 - 78.210.5 mg/dL   Total Protein 7.2  6.0 - 8.3 g/dL   Albumin 3.1 (*) 3.5 - 5.2 g/dL   AST 22  0 - 37 U/L   ALT 33  0 - 53 U/L   Alkaline Phosphatase 57  39 - 117 U/L   Total Bilirubin 0.5  0.3 - 1.2 mg/dL   GFR calc non Af Amer 87 (*) >90 mL/min   GFR calc Af Amer >90  >90 mL/min  GLUCOSE, CAPILLARY      Result Value Ref Range   Glucose-Capillary 175 (*) 70 - 99 mg/dL   Comment 1 Notify RN     Comment 2 Documented in Chart    GLUCOSE, CAPILLARY      Result Value Ref Range   Glucose-Capillary 113 (*) 70 - 99 mg/dL   Comment 1 Notify RN     Comment 2 Documented in Chart    GLUCOSE, CAPILLARY      Result Value Ref Range   Glucose-Capillary 278 (*) 70 - 99 mg/dL  CBG MONITORING, ED      Result Value Ref Range   Glucose-Capillary 356 (*) 70 - 99  mg/dL  I-STAT CHEM 8, ED      Result Value Ref Range   Sodium 138  137 - 147 mEq/L   Potassium 4.0  3.7 - 5.3 mEq/L   Chloride 100  96 -  112 mEq/L   BUN 19  6 - 23 mg/dL   Creatinine, Ser 1.61  0.50 - 1.35 mg/dL   Glucose, Bld 096 (*) 70 - 99 mg/dL   Calcium, Ion 0.45  4.09 - 1.30 mmol/L   TCO2 24  0 - 100 mmol/L   Hemoglobin 17.0  13.0 - 17.0 g/dL   HCT 81.1  91.4 - 78.2 %  I-STAT TROPOININ, ED      Result Value Ref Range   Troponin i, poc 0.01  0.00 - 0.08 ng/mL   Comment 3             The neurologically relevant items on the patient's problem list were reviewed on today's visit.  Neurologic Examination  A problem focused neurological exam (12 or more points of the single system neurologic examination, vital signs counts as 1 point, cranial nerves count for 8 points) was performed.  Blood pressure 137/78, pulse 90, weight 225 lb 12.8 oz (102.422 kg).  General - Well nourished, well developed, in no apparent distress.  Ophthalmologic - Sharp disc margins OU.  Cardiovascular - Regular rate and rhythm with no murmur.  Mental Status -  Level of arousal and orientation to time, place, and person were intact. Language including expression, naming, repetition, comprehension was assessed and found intact. Attention span and concentration were normal. Recent and remote memory were assessed and delayed recall 2/3. Fund of Knowledge was assessed and was intact.  Cranial Nerves II - XII - II - Vision intact OU. III, IV, VI - Extraocular movements intact. V - Facial sensation intact bilaterally. VII - Facial movement intact bilaterally. VIII - Hearing & vestibular intact bilaterally. X - Palate elevates symmetrically. XI - Chin turning & shoulder shrug intact bilaterally. XII - Tongue protrusion intact.  Motor Strength - The patient's strength was normal in all extremities and pronator drift was absent.  Bulk was normal and fasciculations were absent.   Motor Tone - Muscle  tone was assessed at the neck and appendages and was normal.  Reflexes - The patient's reflexes were normal in all extremities and he had no pathological reflexes.  Sensory - temperature/pinprick decreased on the left UE and LE but vibration and proprioception symmetrical bilaterally. Romberg testing was negative.    Coordination - The patient had normal movements in the hands and feet with no ataxia or dysmetria.  Tremor was absent.  Gait and Station - The patient's transfers, posture, gait, station, and turns were observed as normal.  Data reviewed: I personally reviewed the images and agree with the radiology interpretations.  CT of the brain 10/12/2013 Negative head CT.  MRI of the brain 10/12/2013 1. 6 mm linear focus of mildly increased signal intensity on DWI sequence involving the right posterior aspect of the brainstem as above. While this finding may be artifactual in nature, possible acute/subacute ischemia involving this region is not entirely excluded. Correlation with symptoms recommended. 2. No other acute intracranial infarct or other abnormality identified. 3. Mild age-related atrophy with chronic microvascular ischemic disease involving the supratentorial white matter.  MRA of the brain 10/12/2013 1. No high-grade proximal branch occlusion identified within the intracranial circulation. 2. Multi focal atherosclerotic irregularity involving the M1 segments bilaterally, left greater than right, without high-grade flow-limiting stenosis. 3. Focal short segment 4 mm stenosis of at least 50% involving the left M2 segment as above. The left MCA branches are opacified distally. 4. Multi focal atherosclerotic irregularity involving the P2 segments bilaterally, right greater than left, without  definite high-grade flow-limiting stenosis. 5. Fetal origin of the right posterior cerebral artery.  2D Echocardiogram EF 55-60% with no source of embolus. Extremely limited due to poor sound wave  transmission; definity used; there appears to be akinesis of the basal inferior posterior wall; overall preserved LV function; if clinically indicated, TEE would have greater sensitivity for source of embolus.  Carotid Doppler No evidence of hemodynamically significant internal carotid artery stenosis. Vertebral artery flow is antegrade.  CXR 10/13/2013 Cardiomegaly, pulmonary venous congestion and bibasilar atelectasis without definite acute cardiopulmonary disease.  EKG normal sinus rhythm. For complete results please see formal report.   Component     Latest Ref Rng 10/13/2013  Cholesterol     0 - 200 mg/dL 409  Triglycerides     <150 mg/dL 811 (H)  HDL     >91 mg/dL 29 (L)  Total CHOL/HDL Ratio      6.9  VLDL     0 - 40 mg/dL 51 (H)  LDL (calc)     0 - 99 mg/dL 478 (H)  Hemoglobin G9F     <5.7 % 8.4 (H)  Mean Plasma Glucose     <117 mg/dL 621 (H)    Assessment: As you may recall, he is a 63 y.o. Caucasian male with a diagnosis of stroke. He was admitted in 09/2013 due to dizziness not vertigo, swallow difficulty, slurry speech and left arm and leg numbness. 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 Etiology felt to be small vessel ischemic stroke although pt stated that he had cardiac cath one week prior to the event. He needs continue his stroke risk factor control and monitor BP and CBG at home. Continue PT/OT/speech. Continue plavix but talk with PCP regarding statin vs. Febrite.   Plan:  Continue Plavix for stroke prevention Would like statin for stroke prevention, but pt will discuss with his PCP soon for pravastatin or fibrates.  Continue PT/OT/speech Follow up with endocrinologist for DM control Continue meds for HTN and DM and monitor glucose and BP at home Follow up with PCP soon for risk factor modification RTC in 6 months.  Diagnoses from this visit: Stroke  Type 2 diabetes mellitus without  complication  Essential hypertension  HLD (hyperlipidemia)    No orders of the defined types were placed in this encounter.   No orders of the defined types were placed in this encounter.   Patient Instructions  - continue plavix for stroke prevention - appointment with PCP in 3 month for stroke risk factor modification, especially for high LDL considering pravastatin or fibrates.  - follow up with endocrinologist for the DM control - continue PT/OT/speech - control OSA by the way you feel more comfortable. - follow up in 6 months.    Marvel Plan, MD PhD [facility] [address], [address] [place], [place] [address] [phone]

## 2014-03-27 NOTE — Patient Instructions (Signed)
-   continue plavix for stroke prevention - appointment with PCP in 3 month for stroke risk factor modification, especially for high LDL considering pravastatin or fibrates.  - follow up with endocrinologist for the DM control - continue PT/OT/speech - control OSA by the way you feel more comfortable. - follow up in 6 months.

## 2014-03-28 ENCOUNTER — Encounter: Payer: Medicare Other | Admitting: Speech Pathology

## 2014-03-30 ENCOUNTER — Ambulatory Visit: Payer: Medicare Other | Admitting: Physical Therapy

## 2014-04-03 ENCOUNTER — Ambulatory Visit: Payer: Medicare Other | Admitting: Physical Therapy

## 2014-04-03 ENCOUNTER — Ambulatory Visit: Payer: Medicare Other

## 2014-04-03 DIAGNOSIS — I69954 Hemiplegia and hemiparesis following unspecified cerebrovascular disease affecting left non-dominant side: Secondary | ICD-10-CM | POA: Diagnosis not present

## 2014-04-06 ENCOUNTER — Encounter: Payer: Medicare Other | Admitting: Speech Pathology

## 2014-04-06 ENCOUNTER — Ambulatory Visit: Payer: Medicare Other | Admitting: Occupational Therapy

## 2014-04-06 DIAGNOSIS — I69954 Hemiplegia and hemiparesis following unspecified cerebrovascular disease affecting left non-dominant side: Secondary | ICD-10-CM | POA: Diagnosis not present

## 2014-04-10 ENCOUNTER — Ambulatory Visit: Payer: Medicare Other | Admitting: Occupational Therapy

## 2014-04-10 ENCOUNTER — Ambulatory Visit: Payer: Medicare Other

## 2014-04-13 ENCOUNTER — Ambulatory Visit: Payer: Medicare Other

## 2014-04-13 ENCOUNTER — Encounter: Payer: Medicare Other | Admitting: Occupational Therapy

## 2014-04-17 ENCOUNTER — Ambulatory Visit: Payer: Medicare Other

## 2014-04-17 ENCOUNTER — Encounter: Payer: Medicare Other | Admitting: Occupational Therapy

## 2014-04-18 ENCOUNTER — Ambulatory Visit: Payer: Medicare Other

## 2014-04-18 ENCOUNTER — Encounter: Payer: Medicare Other | Admitting: Occupational Therapy

## 2014-04-21 ENCOUNTER — Ambulatory Visit: Payer: Medicare Other

## 2014-04-21 ENCOUNTER — Encounter: Payer: Medicare Other | Admitting: Occupational Therapy

## 2014-04-25 ENCOUNTER — Encounter: Payer: Medicare Other | Admitting: Occupational Therapy

## 2014-04-25 ENCOUNTER — Ambulatory Visit: Payer: Medicare Other | Admitting: Physical Therapy

## 2014-04-28 ENCOUNTER — Encounter: Payer: Medicare Other | Admitting: Occupational Therapy

## 2014-04-28 ENCOUNTER — Ambulatory Visit: Payer: Medicare Other

## 2014-05-03 ENCOUNTER — Ambulatory Visit: Payer: Medicare Other

## 2014-05-03 ENCOUNTER — Encounter: Payer: Medicare Other | Admitting: Occupational Therapy

## 2014-05-05 ENCOUNTER — Ambulatory Visit: Payer: Medicare Other

## 2014-05-05 ENCOUNTER — Encounter: Payer: Medicare Other | Admitting: Occupational Therapy

## 2014-06-01 ENCOUNTER — Encounter (HOSPITAL_COMMUNITY): Payer: Self-pay | Admitting: Cardiology

## 2014-09-26 ENCOUNTER — Ambulatory Visit: Payer: Medicare Other | Admitting: Neurology

## 2015-06-09 ENCOUNTER — Encounter (HOSPITAL_BASED_OUTPATIENT_CLINIC_OR_DEPARTMENT_OTHER): Payer: Self-pay | Admitting: Emergency Medicine

## 2015-06-09 ENCOUNTER — Emergency Department (HOSPITAL_BASED_OUTPATIENT_CLINIC_OR_DEPARTMENT_OTHER)
Admission: EM | Admit: 2015-06-09 | Discharge: 2015-06-09 | Disposition: A | Discharge status: Home / Self Care | Payer: Medicare Other | Attending: Emergency Medicine | Admitting: Emergency Medicine

## 2015-06-09 DIAGNOSIS — Z79899 Other long term (current) drug therapy: Secondary | ICD-10-CM | POA: Diagnosis not present

## 2015-06-09 DIAGNOSIS — I10 Essential (primary) hypertension: Secondary | ICD-10-CM | POA: Diagnosis not present

## 2015-06-09 DIAGNOSIS — Z951 Presence of aortocoronary bypass graft: Secondary | ICD-10-CM | POA: Insufficient documentation

## 2015-06-09 DIAGNOSIS — R0981 Nasal congestion: Secondary | ICD-10-CM | POA: Diagnosis present

## 2015-06-09 DIAGNOSIS — J019 Acute sinusitis, unspecified: Secondary | ICD-10-CM | POA: Diagnosis not present

## 2015-06-09 DIAGNOSIS — Z7902 Long term (current) use of antithrombotics/antiplatelets: Secondary | ICD-10-CM | POA: Diagnosis not present

## 2015-06-09 DIAGNOSIS — I2510 Atherosclerotic heart disease of native coronary artery without angina pectoris: Secondary | ICD-10-CM | POA: Diagnosis not present

## 2015-06-09 DIAGNOSIS — Z880 Allergy status to penicillin: Secondary | ICD-10-CM | POA: Diagnosis not present

## 2015-06-09 DIAGNOSIS — E119 Type 2 diabetes mellitus without complications: Secondary | ICD-10-CM | POA: Insufficient documentation

## 2015-06-09 DIAGNOSIS — Z794 Long term (current) use of insulin: Secondary | ICD-10-CM | POA: Diagnosis not present

## 2015-06-09 MED ORDER — DOXYCYCLINE HYCLATE 100 MG PO CAPS: 100.0000 mg | ORAL_CAPSULE | Freq: Two times a day (BID) | ORAL | Status: AC

## 2015-06-09 NOTE — Discharge Instructions (Signed)

## 2015-06-09 NOTE — ED Provider Notes (Signed)
CSN: [id number]     Arrival date & time 06/09/15  0920 History   First MD Initiated Contact with Patient 06/09/15 [id number]     Chief Complaint  Patient presents with  . Nasal Congestion      HPI Patient has chief complaint of sinus congestion and sinus pressure with productive cough over the last few days.  Been using a steroid spray with no relief.  Denies any fever.  Denies nausea vomiting.  Past Medical History  Diagnosis Date  . CAD (coronary artery disease)     s/p CABG 2004  . HTN (hypertension)   . Hyperlipidemia   . H/O splenectomy   . Diabetes mellitus [facility](HCC)    Past Surgical History  Procedure Laterality Date  . Left heart catheterization with coronary angiogram N/A 09/16/2013    Procedure: LEFT HEART CATHETERIZATION WITH CORONARY ANGIOGRAM;  Surgeon: Peter M SwazilandJordan, MD;  [facility];  Service: Cardiovascular;  Laterality: N/A;  . Cardiac surgery     Family History  Problem Relation Age of Onset  . Heart attack Father     died [id number] 38- 62 y/o  . Heart failure Father   . CAD Mother     s/p CABG 1996   Social History  Substance Use Topics  . Smoking status: Never Smoker   . Smokeless tobacco: None  . Alcohol Use: No    Review of Systems  All other systems reviewed and are negative  Allergies  Eggs or egg-derived products; Other; Codeine; and Penicillins  Home Medications   Prior to Admission medications   Medication Sig Start Date End Date Taking? Authorizing Provider  amLODipine (NORVASC) 5 MG tablet Take 5 mg by mouth daily.    Historical Provider, MD  benazepril (LOTENSIN) 40 MG tablet Take 40 mg by mouth daily.    Historical Provider, MD  Canagliflozin (INVOKANA) 300 MG TABS Take 300 mg by mouth daily.     Historical Provider, MD  cetirizine (ZYRTEC) 10 MG tablet Take 10 mg by mouth daily.    Historical Provider, MD  clopidogrel (PLAVIX) 75 MG tablet Take 75 mg by mouth daily with breakfast.    Historical Provider, MD  doxycycline (VIBRAMYCIN)  100 MG capsule Take 1 capsule (100 mg total) by mouth 2 (two) times daily. 06/09/15   Nelva Nayobert Emmry Hinsch, MD  famotidine (PEPCID) 40 MG tablet Take 40 mg by mouth daily with supper.     Historical Provider, MD  gabapentin (NEURONTIN) 300 MG capsule Take 300 mg by mouth at bedtime.     Historical Provider, MD  glipiZIDE (GLUCOTROL XL) 10 MG 24 hr tablet Take 10 mg by mouth 2 (two) times daily.     Historical Provider, MD  Insulin Glargine (LANTUS SOLOSTAR) 100 UNIT/ML Solostar Pen Inject 30 Units into the skin at bedtime.    Historical Provider, MD  insulin lispro (HUMALOG KWIKPEN) 100 UNIT/ML KiwkPen Inject 0-16 Units into the skin 2 (two) times daily as needed (cbg >200).    Historical Provider, MD  meclizine (ANTIVERT) 25 MG tablet Take 25 mg by mouth 3 (three) times daily as needed for dizziness.    Historical Provider, MD  nitroGLYCERIN (NITROLINGUAL) 0.4 MG/SPRAY spray Place 1 spray under the tongue every 5 (five) minutes x 3 doses as needed for chest pain.    Historical Provider, MD  ranolazine (RANEXA) 1000 MG SR tablet Take 500 mg by mouth 2 (two) times daily.    Historical Provider, MD  ULTRA-THIN II SHORT PEN NEEDLE  31G X 8 MM MISC  11/08/13   Historical Provider, MD   BP 212/100 mmHg  Pulse 88  Temp(Src) 97.8 F (36.6 C) (Oral)  Resp 20  Ht  (1.702 m)  Wt 218 lb (98.884 kg)  BMI 34.14 kg/m2  SpO2 100% Physical Exam Physical Exam  Nursing note and vitals reviewed. Constitutional: He is oriented to person, place, and time. He appears well-developed and well-nourished. No distress.  HENT: tenderness on the frontal sinus to percussion.  Nasal congestion with edema bilaterally in the nares. Head: Normocephalic and atraumatic.  Eyes: Pupils are equal, round, and reactive to light.  Neck: Normal range of motion.  Cardiovascular: Normal rate and intact distal pulses.   Pulmonary/Chest: No respiratory distress.lungs clear to auscultation with no wheezes rales or rhonchi.  Abdominal:  Normal appearance. He exhibits no distension.  Musculoskeletal: Normal range of motion.  Neurological: He is alert and oriented to person, place, and time. No cranial nerve deficit.  Skin: Skin is warm and dry. No rash noted.  Psychiatric: He has a normal mood and affect. His behavior is normal.   ED Course  Procedures (including critical care time) Labs Review   MDM   Final diagnoses:  Acute sinusitis, recurrence not specified, unspecified location        Nelva Nay, MD 06/09/15 [phone]

## 2015-06-09 NOTE — ED Notes (Signed)
Pt reports congestion, sinus pressure and cough since wed, reports difficulty breathing when laying flat

## 2015-07-24 IMAGING — US US ABDOMEN COMPLETE
1 series · 14 of 25 positions shown · non-contrast
Comparison: None.

CLINICAL DATA: Nausea.  Splenectomy.  Cholecystectomy.

EXAM:
ULTRASOUND ABDOMEN COMPLETE

[Series 1: us abdomen complete · 0.26mm/px · 14 of 32 slices shown]
[im 1/32]
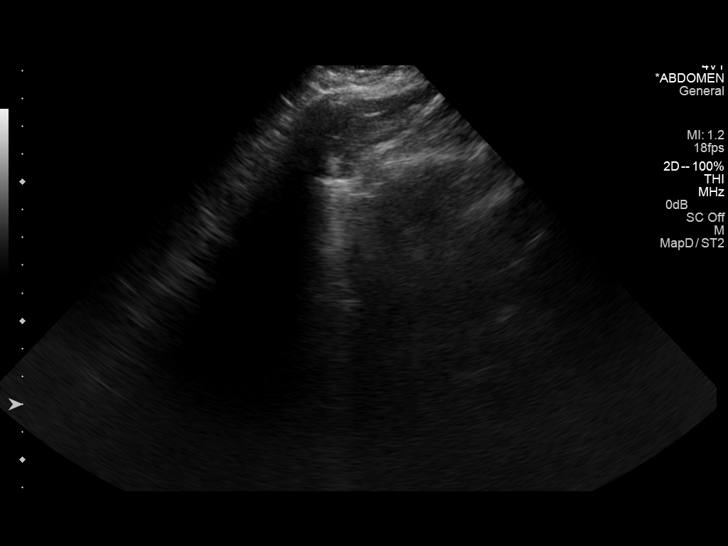
[im 3/32]
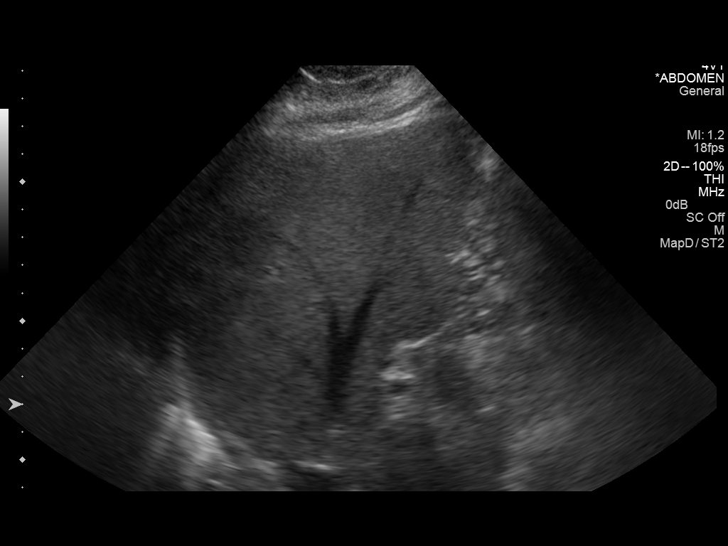
[im 6/32]
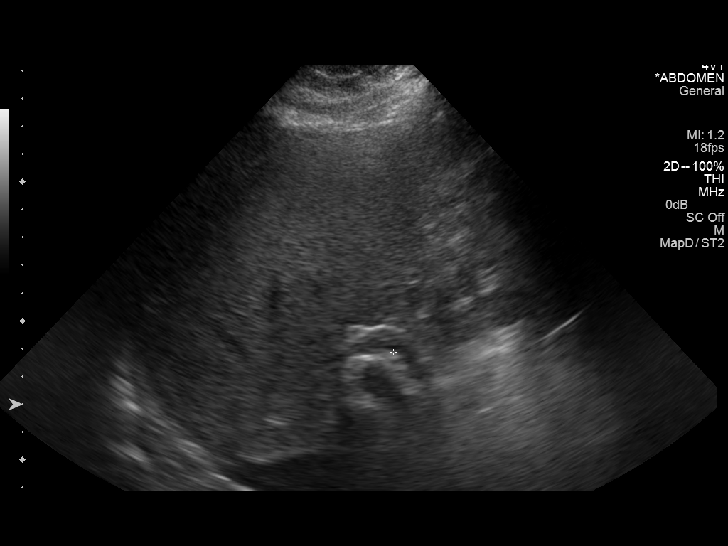
[im 8/32]
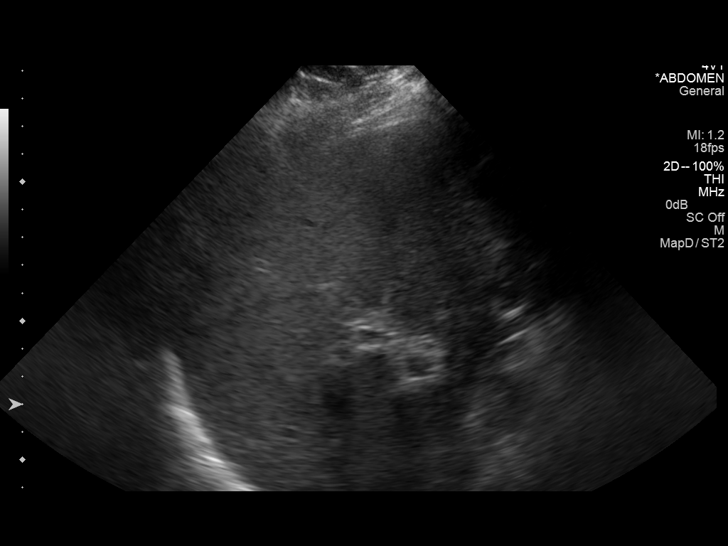
[im 11/32]
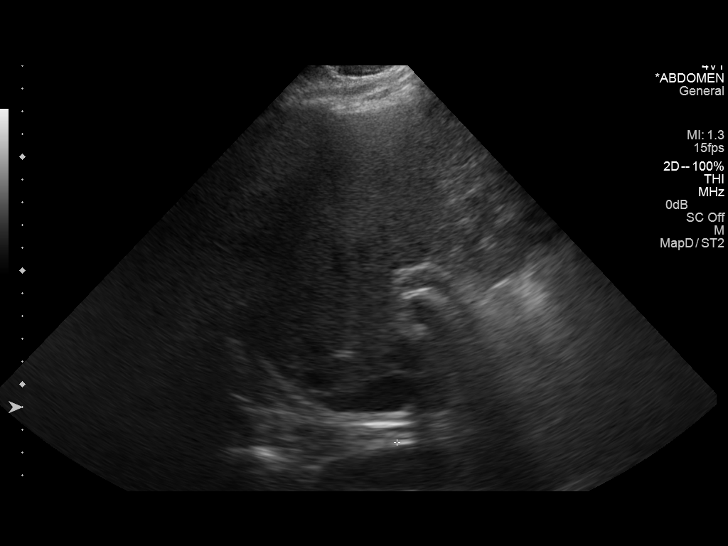
[im 12/32]
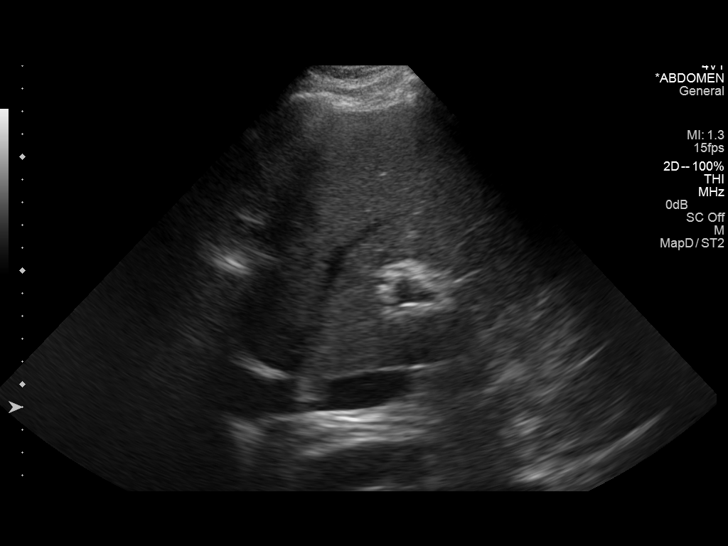
[im 15/32]
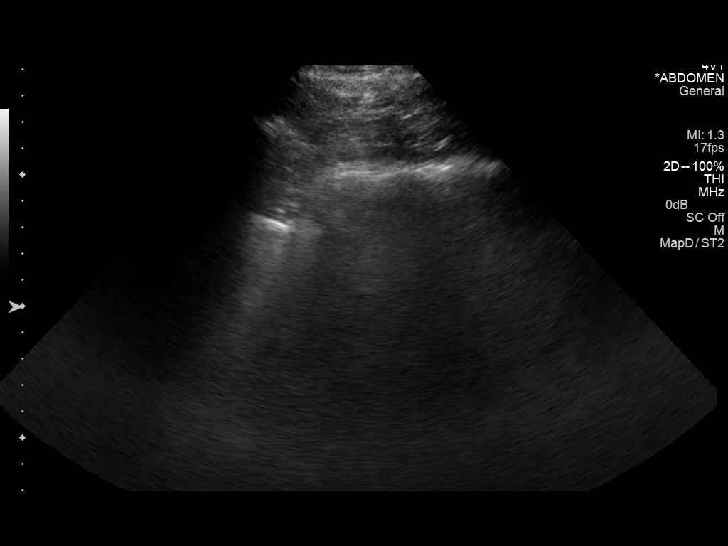
[im 17/32]
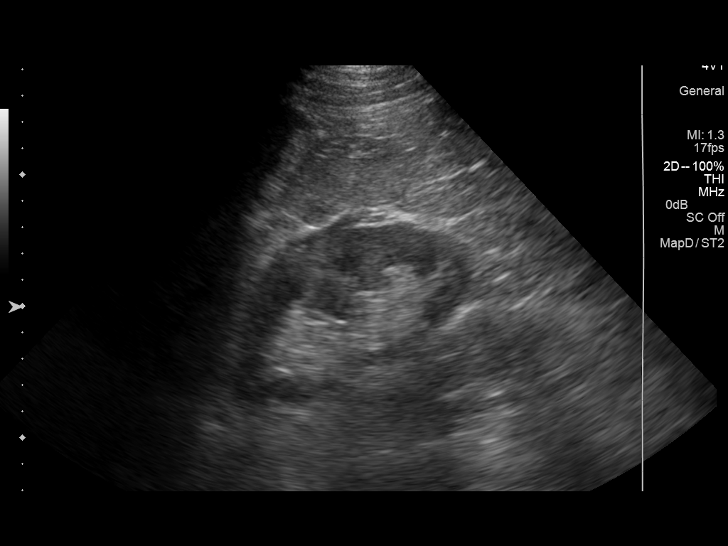
[im 20/32]
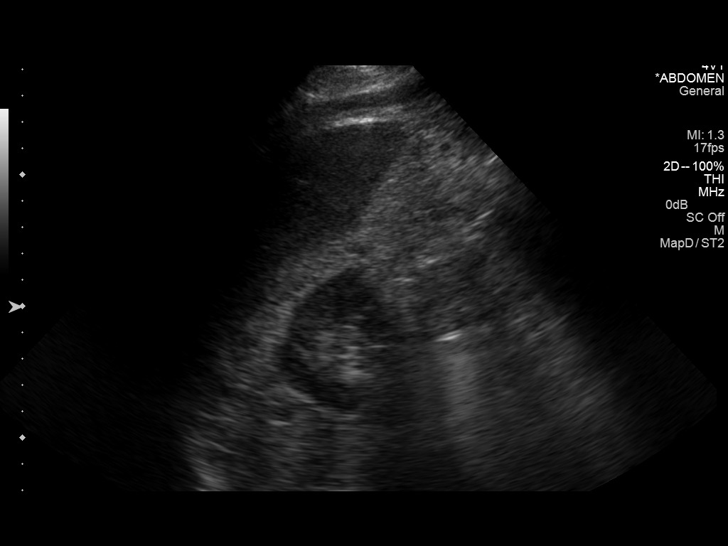
[im 21/32]
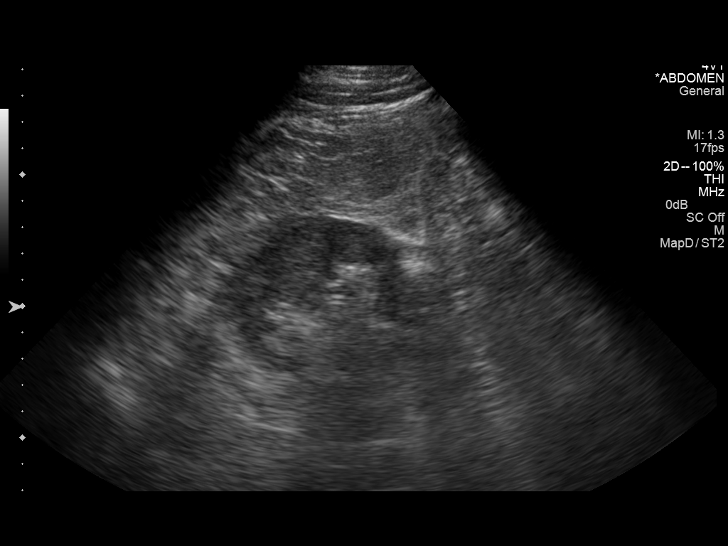
[im 24/32]
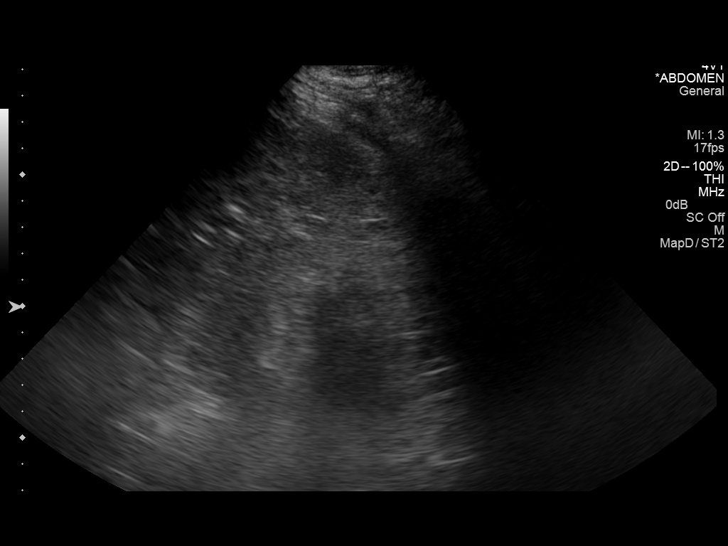
[im 26/32]
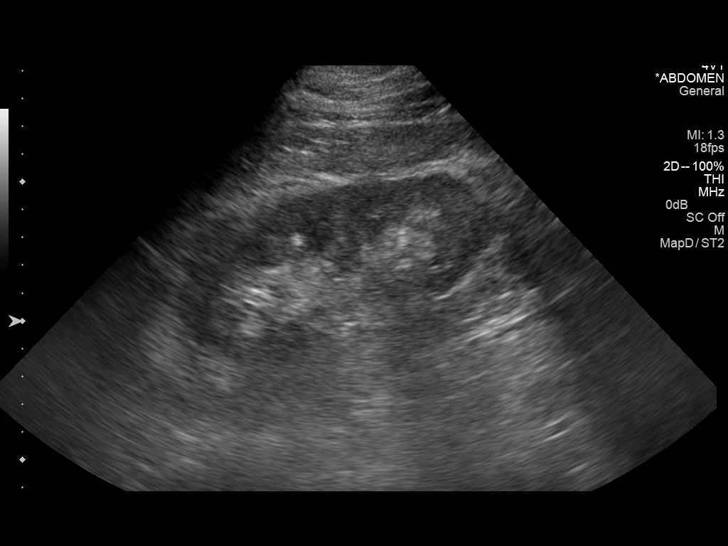
[im 29/32]
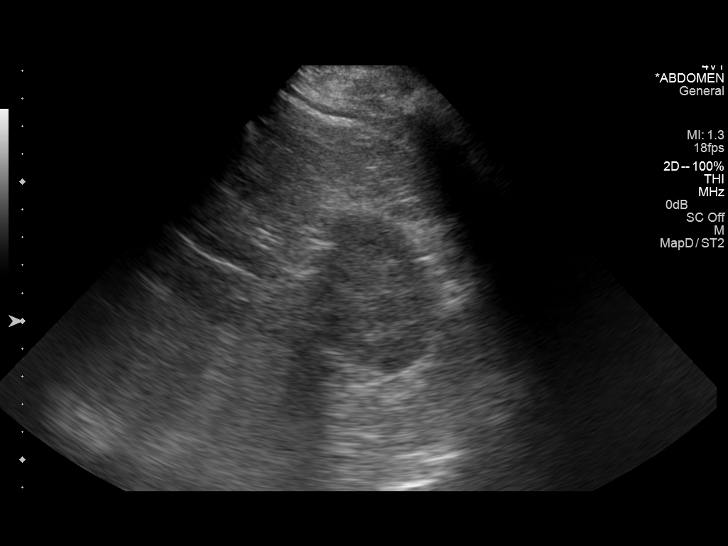
[im 32/32]
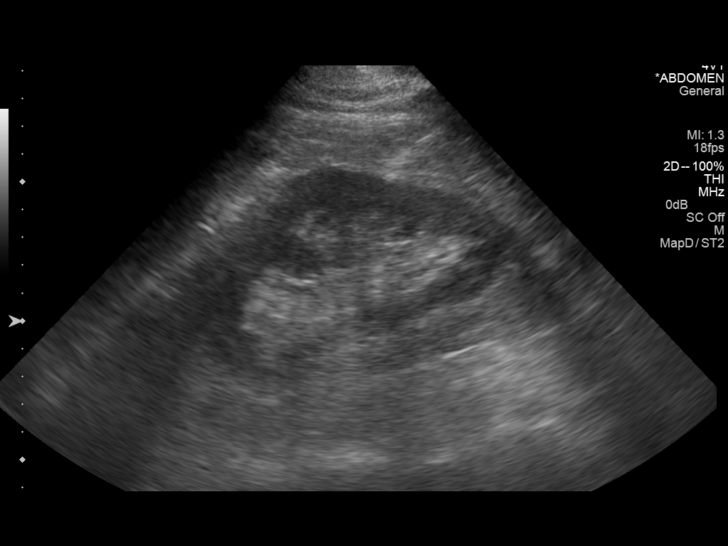

[14 of 25 positions shown; findings below may reference images not displayed]

FINDINGS: Gallbladder:

Absent.

Common bile duct:

Diameter: 7 mm.

Liver:

No focal lesion identified. Within normal limits in parenchymal
echogenicity.

IVC:

No abnormality visualized.

Pancreas:

Obscured.

Spleen:

Not visualized.

Right Kidney:

Length: 10.9 cm in length. Echogenicity within normal limits. No
mass or hydronephrosis visualized.

Left Kidney:

Length: 11.9 cm in length. Echogenicity within normal limits. No
mass or hydronephrosis visualized.

Abdominal aorta:

Partially obscured.  No obvious aneurysm.

Other findings:

None.
IMPRESSION: No acute intra-abdominal pathology.

Pancreas was obscured.

Spleen and gallbladder are were absent consistent with the given
history.

## 2015-07-24 IMAGING — CR DG CHEST 2V
3 series · 3 of 3 positions shown · non-contrast
Comparison: None.

CLINICAL DATA: Stroke, left-sided weakness, nausea for 1 day,
history of hypertension, diabetes, pulmonary disease, CAD

EXAM:
CHEST  2 VIEW

[w chest lat (1 of 2)]
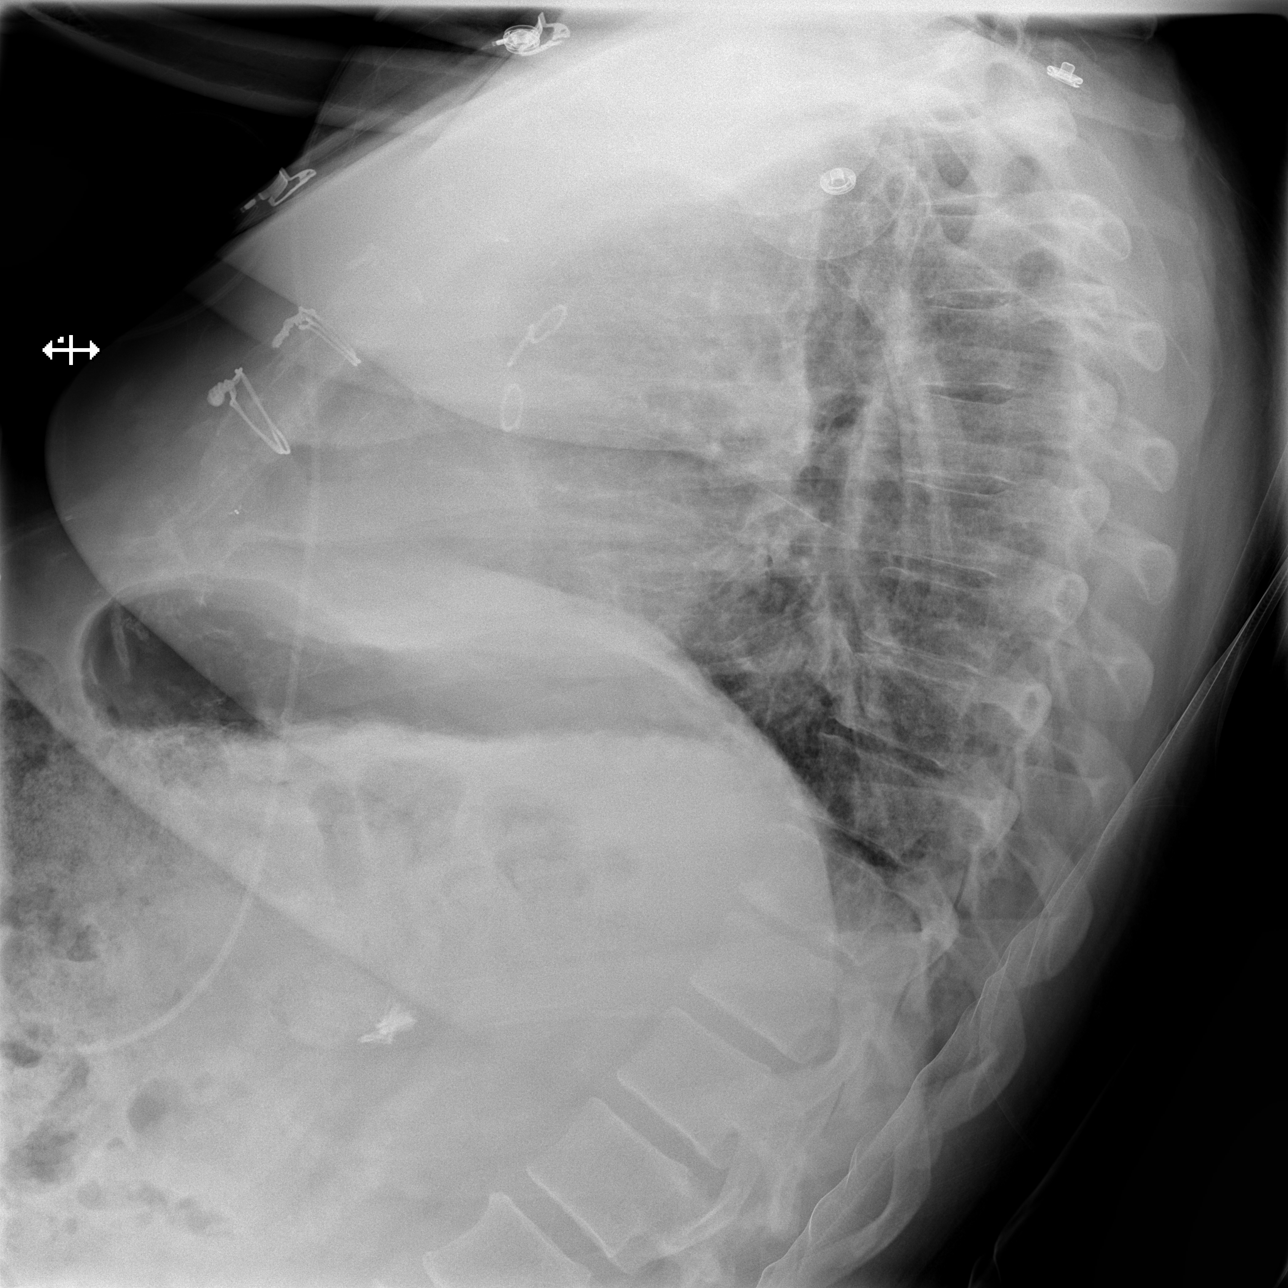

[w chest lat (2 of 2)]
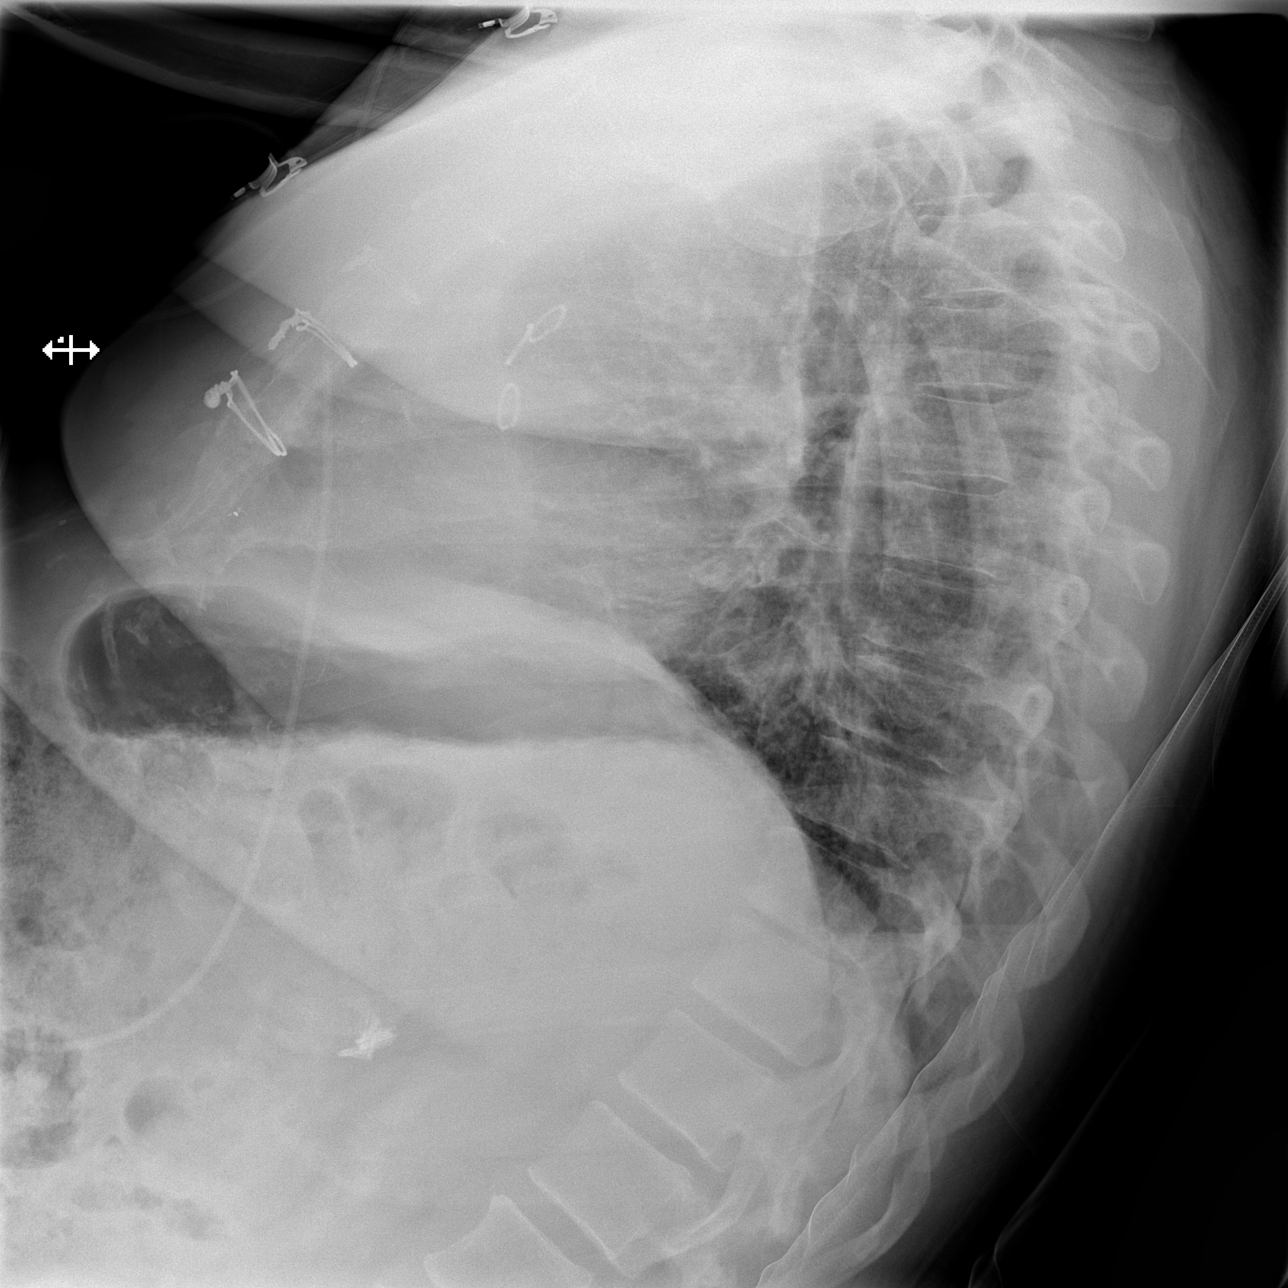

[x chest ap]
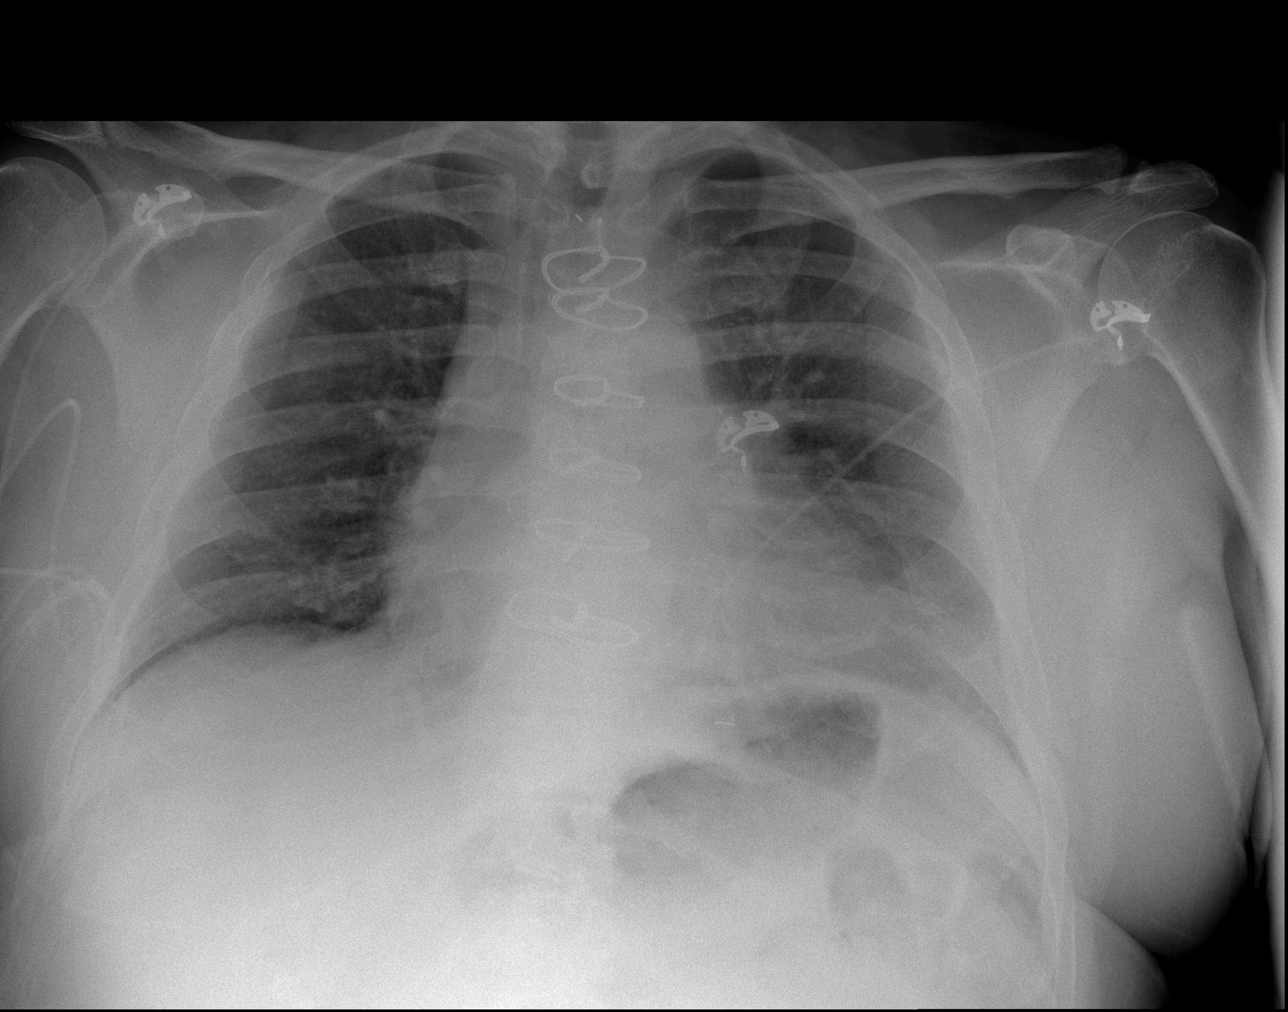

[3 of 3 positions shown; findings below may reference images not displayed]

FINDINGS: Examination is degraded due to patient body habitus. Enlarged
cardiac silhouette and mediastinal contours post median sternotomy
and CABG. Evaluation of the retrosternal clear space is obscured
secondary to overlying soft tissues. Mild pulmonary venous
congestion without frank evidence of edema. Minimal bibasilar
opacities favored to represent atelectasis. No discrete focal
airspace opacities. No pleural effusion or pneumothorax. No definite
evidence of edema. No acute osseus abnormalities. Post
cholecystectomy. An additional surgical clip overlies expected
location of the gastroesophageal junction.
IMPRESSION: Cardiomegaly, pulmonary venous congestion and bibasilar atelectasis
without definite acute cardiopulmonary disease.

## 2016-03-23 DEATH — deceased
